# Patient Record
Sex: Female | Born: 1950 | Race: White | Hispanic: No | Marital: Single | Smoking: Former smoker
Health system: Southern US, Community
[De-identification: ages and names within clinical notes are randomized; demographics above are authoritative.]

## PROBLEM LIST (undated history)

## (undated) DIAGNOSIS — G2581 Restless legs syndrome: Secondary | ICD-10-CM

## (undated) DIAGNOSIS — E042 Nontoxic multinodular goiter: Secondary | ICD-10-CM

## (undated) DIAGNOSIS — N841 Polyp of cervix uteri: Secondary | ICD-10-CM

## (undated) DIAGNOSIS — K219 Gastro-esophageal reflux disease without esophagitis: Secondary | ICD-10-CM

## (undated) DIAGNOSIS — I340 Nonrheumatic mitral (valve) insufficiency: Secondary | ICD-10-CM

## (undated) DIAGNOSIS — Z972 Presence of dental prosthetic device (complete) (partial): Secondary | ICD-10-CM

## (undated) DIAGNOSIS — Z86010 Personal history of colonic polyps: Secondary | ICD-10-CM

## (undated) DIAGNOSIS — G4700 Insomnia, unspecified: Secondary | ICD-10-CM

## (undated) DIAGNOSIS — Z9889 Other specified postprocedural states: Secondary | ICD-10-CM

## (undated) DIAGNOSIS — R112 Nausea with vomiting, unspecified: Secondary | ICD-10-CM

## (undated) DIAGNOSIS — T7840XA Allergy, unspecified, initial encounter: Secondary | ICD-10-CM

## (undated) DIAGNOSIS — M1990 Unspecified osteoarthritis, unspecified site: Secondary | ICD-10-CM

## (undated) DIAGNOSIS — R011 Cardiac murmur, unspecified: Secondary | ICD-10-CM

## (undated) DIAGNOSIS — K449 Diaphragmatic hernia without obstruction or gangrene: Secondary | ICD-10-CM

## (undated) DIAGNOSIS — Z8489 Family history of other specified conditions: Secondary | ICD-10-CM

## (undated) DIAGNOSIS — H21569 Pupillary abnormality, unspecified eye: Secondary | ICD-10-CM

## (undated) DIAGNOSIS — E785 Hyperlipidemia, unspecified: Secondary | ICD-10-CM

## (undated) DIAGNOSIS — D179 Benign lipomatous neoplasm, unspecified: Secondary | ICD-10-CM

## (undated) DIAGNOSIS — M5030 Other cervical disc degeneration, unspecified cervical region: Secondary | ICD-10-CM

## (undated) DIAGNOSIS — M2040 Other hammer toe(s) (acquired), unspecified foot: Secondary | ICD-10-CM

## (undated) DIAGNOSIS — H5704 Mydriasis: Secondary | ICD-10-CM

## (undated) DIAGNOSIS — K2970 Gastritis, unspecified, without bleeding: Secondary | ICD-10-CM

## (undated) HISTORY — DX: Cardiac murmur, unspecified: R01.1

## (undated) HISTORY — DX: Polyp of cervix uteri: N84.1

## (undated) HISTORY — PX: THUMB FUSION: SUR636

## (undated) HISTORY — DX: Diaphragmatic hernia without obstruction or gangrene: K44.9

## (undated) HISTORY — PX: HAMMER TOE SURGERY: SHX385

## (undated) HISTORY — DX: Other hammer toe(s) (acquired), unspecified foot: M20.40

## (undated) HISTORY — PX: OTHER SURGICAL HISTORY: SHX169

## (undated) HISTORY — DX: Gastro-esophageal reflux disease without esophagitis: K21.9

## (undated) HISTORY — DX: Pupillary abnormality, unspecified eye: H21.569

## (undated) HISTORY — DX: Hyperlipidemia, unspecified: E78.5

## (undated) HISTORY — DX: Unspecified osteoarthritis, unspecified site: M19.90

## (undated) HISTORY — PX: SPINE SURGERY: SHX786

## (undated) HISTORY — DX: Allergy, unspecified, initial encounter: T78.40XA

## (undated) HISTORY — DX: Nonrheumatic mitral (valve) insufficiency: I34.0

## (undated) HISTORY — PX: PUPILLOPLASTY: SHX2277

## (undated) HISTORY — PX: EYE SURGERY: SHX253

## (undated) HISTORY — PX: CATARACT EXTRACTION: SUR2

---

## 1999-05-24 ENCOUNTER — Other Ambulatory Visit: Admission: RE | Admit: 1999-05-24 | Discharge: 1999-05-24 | Payer: Self-pay | Admitting: Internal Medicine

## 2000-07-20 ENCOUNTER — Other Ambulatory Visit: Admission: RE | Admit: 2000-07-20 | Discharge: 2000-07-20 | Payer: Self-pay | Admitting: Obstetrics & Gynecology

## 2001-04-12 DIAGNOSIS — M5030 Other cervical disc degeneration, unspecified cervical region: Secondary | ICD-10-CM

## 2001-04-12 HISTORY — DX: Other cervical disc degeneration, unspecified cervical region: M50.30

## 2001-05-09 ENCOUNTER — Encounter: Admission: RE | Admit: 2001-05-09 | Discharge: 2001-05-09 | Payer: Self-pay | Admitting: Internal Medicine

## 2001-05-09 ENCOUNTER — Encounter: Payer: Self-pay | Admitting: Internal Medicine

## 2002-01-22 ENCOUNTER — Encounter: Admission: RE | Admit: 2002-01-22 | Discharge: 2002-02-15 | Payer: Self-pay | Admitting: Orthopedic Surgery

## 2005-01-05 ENCOUNTER — Encounter: Admission: RE | Admit: 2005-01-05 | Discharge: 2005-01-05 | Payer: Self-pay | Admitting: Internal Medicine

## 2005-01-20 ENCOUNTER — Ambulatory Visit: Payer: Self-pay | Admitting: Gastroenterology

## 2005-02-25 ENCOUNTER — Ambulatory Visit: Payer: Self-pay | Admitting: Gastroenterology

## 2009-07-24 ENCOUNTER — Ambulatory Visit (HOSPITAL_COMMUNITY): Admission: RE | Admit: 2009-07-24 | Discharge: 2009-07-24 | Payer: Self-pay | Admitting: Emergency Medicine

## 2010-01-22 ENCOUNTER — Encounter (INDEPENDENT_AMBULATORY_CARE_PROVIDER_SITE_OTHER): Payer: Self-pay | Admitting: *Deleted

## 2010-07-30 ENCOUNTER — Encounter: Admission: RE | Admit: 2010-07-30 | Discharge: 2010-07-30 | Payer: Self-pay | Admitting: Orthopedic Surgery

## 2010-10-12 ENCOUNTER — Other Ambulatory Visit: Payer: Self-pay | Admitting: Orthopedic Surgery

## 2010-10-12 DIAGNOSIS — M5412 Radiculopathy, cervical region: Secondary | ICD-10-CM

## 2010-10-14 NOTE — Letter (Signed)
Summary: Colonoscopy Letter  [facility]  [address] [place], [place] [address]   Phone: [phone]  Fax: [phone]      Jan 22, 2010 MRN: [id number]   [facility] [address] [place], [place]  [address]   Dear Ms. Ortez,   According to your medical record, it is time for you to schedule a Colonoscopy. The American Cancer Society recommends this procedure as a method to detect early colon cancer. Patients with a family history of colon cancer, or a personal history of colon polyps or inflammatory bowel disease are at increased risk.  This letter has beeen generated based on the recommendations made at the time of your procedure. If you feel that in your particular situation this may no longer apply, please contact our office.  Please call our office at [phone] to schedule this appointment or to update your records at your earliest convenience.  Thank you for cooperating with [place] to provide you with the very best care possible.   Sincerely,  Judie Petit T. Russella Dar, M.D.  [facility] [phone]

## 2010-10-16 ENCOUNTER — Encounter: Payer: Self-pay | Admitting: Orthopedic Surgery

## 2010-10-18 ENCOUNTER — Ambulatory Visit
Admission: RE | Admit: 2010-10-18 | Discharge: 2010-10-18 | Disposition: A | Discharge status: Home / Self Care | Payer: 59 | Source: Ambulatory Visit | Attending: Orthopedic Surgery | Admitting: Orthopedic Surgery

## 2010-10-18 ENCOUNTER — Other Ambulatory Visit: Payer: Self-pay | Admitting: Orthopedic Surgery

## 2010-10-18 DIAGNOSIS — M5412 Radiculopathy, cervical region: Secondary | ICD-10-CM

## 2010-10-19 ENCOUNTER — Other Ambulatory Visit: Payer: Self-pay | Admitting: Internal Medicine

## 2010-10-19 DIAGNOSIS — E041 Nontoxic single thyroid nodule: Secondary | ICD-10-CM

## 2011-04-29 ENCOUNTER — Telehealth: Payer: Self-pay

## 2011-04-29 NOTE — Telephone Encounter (Signed)
Called patient to schedule recall Colonoscopy and patient states she would also like to have a Upper Endoscopy. Pt states she has had a lot of indigestion recently and needs to have a procedure for this. I rescheduled Colonoscopy for a office visit to discuss Upper Endoscopy. Pt will Dr. Russella Dar on 05/23/11 at 9:45am. Pt verbalized understanding.

## 2011-05-23 ENCOUNTER — Encounter: Payer: Self-pay | Admitting: Gastroenterology

## 2011-05-23 ENCOUNTER — Ambulatory Visit (INDEPENDENT_AMBULATORY_CARE_PROVIDER_SITE_OTHER): Payer: 59 | Admitting: Gastroenterology

## 2011-05-23 ENCOUNTER — Encounter: Payer: Self-pay | Admitting: Internal Medicine

## 2011-05-23 VITALS — BP 128/76 | HR 60 | Ht 67.0 in | Wt 146.0 lb

## 2011-05-23 DIAGNOSIS — Z8371 Family history of colonic polyps: Secondary | ICD-10-CM

## 2011-05-23 DIAGNOSIS — K219 Gastro-esophageal reflux disease without esophagitis: Secondary | ICD-10-CM

## 2011-05-23 DIAGNOSIS — Z83719 Family history of colon polyps, unspecified: Secondary | ICD-10-CM

## 2011-05-23 MED ORDER — NA SULFATE-K SULFATE-MG SULF 17.5-3.13-1.6 GM/177ML PO SOLN: ORAL | Status: DC

## 2011-05-23 NOTE — Patient Instructions (Signed)
You have been scheduled for an endoscopy and colonoscopy. Please follow the written instructions given to you at your visit today. Please pick up your Suprep at the pharmacy within the next 2-3 days. CC: Dr Earl Lites

## 2011-05-23 NOTE — Progress Notes (Signed)
History of Present Illness: This is a 60 year old female that I have seen in the past. She has a history of GERD and H. pylori gastritis. She underwent colonoscopy and upper endoscopy in June 2006 for family history of colon polyps and reflux symptoms. Cannot determine from the records if H. pylori was treated. She states that she has remained on medications for acid reflux since that time and currently is taking pantoprazole 40 mg twice daily. She feels that Protonix worked more effectively for controlling her reflux symptoms. At this point she notes about 2 episodes of breakthrough reflux symptoms each month. She takes Naprosyn twice each day. Denies weight loss, abdominal pain, constipation, diarrhea, change in stool caliber, melena, hematochezia, nausea, vomiting, dysphagia, chest pain.  Past Medical History  Diagnosis Date  . GERD (gastroesophageal reflux disease)   . Hyperlipidemia   . Endocervical polyp   . Mitral regurgitation   . Hammertoe   . Pupil irregular   . Arthritis   . Hiatal hernia   . Heart murmur    Past Surgical History  Procedure Date  . Hammer toe surgery   . Pupilloplasty     reports that she has quit smoking. She has never used smokeless tobacco. She reports that she drinks alcohol. She reports that she does not use illicit drugs. family history includes Colon polyps in her sister; Diabetes type II in her mother and sister; Heart disease in her father; Hypertension in her brother and sister; and Lung cancer in her mother.  There is no history of Colon cancer. No Known Allergies    Outpatient Encounter Prescriptions as of 05/23/2011  Medication Sig Dispense Refill  . naproxen (NAPROSYN) 500 MG tablet Take 500 mg by mouth 2 (two) times daily with a meal.        . pantoprazole (PROTONIX) 40 MG tablet Take 40 mg by mouth 2 (two) times daily.        . Na Sulfate-K Sulfate-Mg Sulf SOLN Use 1 kit as directed  1 Bottle  0   Review of Systems: Pertinent positive and  negative review of systems were noted in the above HPI section. All other review of systems were otherwise negative.  Physical Exam: General: Well developed , well nourished, no acute distress Head: Normocephalic and atraumatic Eyes:  sclerae anicteric, EOMI Ears: Normal auditory acuity Mouth: No deformity or lesions Neck: Supple, no masses or thyromegaly Lungs: Clear throughout to auscultation Heart: Regular rate and rhythm; no murmurs, rubs or bruits Abdomen: Soft, non tender and non distended. No masses, hepatosplenomegaly or hernias noted. Normal Bowel sounds Rectal: Deferred to colonoscopy Musculoskeletal: Symmetrical with no gross deformities  Skin: No lesions on visible extremities Pulses:  Normal pulses noted Extremities: No clubbing, cyanosis, edema or deformities noted Neurological: Alert oriented x 4, grossly nonfocal Cervical Nodes:  No significant cervical adenopathy Inguinal Nodes: No significant inguinal adenopathy Psychological:  Alert and cooperative. Normal mood and affect  Assessment and Recommendations:  1. Chronic GERD. Continue pantoprazole 40 mg twice daily along with standard antireflux measures. Rule out Barrett's esophagus and erosive esophagitis. Schedule upper endoscopy. The risks, benefits, and alternatives to endoscopy with possible biopsy and possible dilation were discussed with the patient and they consent to proceed.   2. History of H. pylori infection. Attempt to obtain records from 2006. Reassess endoscopy as above.  3. Family history of colon polyps in a sister. High risk for colorectal cancer. Schedule screening colonoscopy. The risks, benefits, and alternatives to colonoscopy with possible biopsy  and possible polypectomy were discussed with the patient and they consent to proceed.

## 2011-05-25 ENCOUNTER — Ambulatory Visit: Payer: 59 | Admitting: Gastroenterology

## 2011-05-27 ENCOUNTER — Ambulatory Visit (AMBULATORY_SURGERY_CENTER): Payer: 59 | Admitting: Gastroenterology

## 2011-05-27 ENCOUNTER — Encounter: Payer: Self-pay | Admitting: Gastroenterology

## 2011-05-27 VITALS — BP 133/77 | HR 68 | Temp 98.2°F | Resp 20 | Ht 67.0 in | Wt 146.0 lb

## 2011-05-27 DIAGNOSIS — K2970 Gastritis, unspecified, without bleeding: Secondary | ICD-10-CM

## 2011-05-27 DIAGNOSIS — K449 Diaphragmatic hernia without obstruction or gangrene: Secondary | ICD-10-CM

## 2011-05-27 DIAGNOSIS — K229 Disease of esophagus, unspecified: Secondary | ICD-10-CM

## 2011-05-27 DIAGNOSIS — K219 Gastro-esophageal reflux disease without esophagitis: Secondary | ICD-10-CM

## 2011-05-27 DIAGNOSIS — Z8371 Family history of colonic polyps: Secondary | ICD-10-CM

## 2011-05-27 DIAGNOSIS — Z1211 Encounter for screening for malignant neoplasm of colon: Secondary | ICD-10-CM

## 2011-05-27 DIAGNOSIS — K209 Esophagitis, unspecified: Secondary | ICD-10-CM

## 2011-05-27 DIAGNOSIS — K2990 Gastroduodenitis, unspecified, without bleeding: Secondary | ICD-10-CM

## 2011-05-27 HISTORY — DX: Gastritis, unspecified, without bleeding: K29.70

## 2011-05-27 HISTORY — DX: Diaphragmatic hernia without obstruction or gangrene: K44.9

## 2011-05-27 HISTORY — PX: ESOPHAGOGASTRODUODENOSCOPY: SHX1529

## 2011-05-27 MED ORDER — SODIUM CHLORIDE 0.9 % IV SOLN: 500.0000 mL | INTRAVENOUS | Status: DC

## 2011-05-27 NOTE — Patient Instructions (Signed)
Follow your discharge instructions.  Continue your PPI.  Await pathology results.

## 2011-05-30 ENCOUNTER — Telehealth: Payer: Self-pay

## 2011-05-30 NOTE — Telephone Encounter (Signed)

## 2011-06-01 ENCOUNTER — Encounter: Payer: Self-pay | Admitting: Gastroenterology

## 2011-10-21 ENCOUNTER — Other Ambulatory Visit: Payer: Self-pay | Admitting: Physician Assistant

## 2011-10-21 MED ORDER — NAPROXEN 500 MG PO TABS: 500.0000 mg | ORAL_TABLET | Freq: Two times a day (BID) | ORAL | Status: DC

## 2011-12-02 ENCOUNTER — Other Ambulatory Visit: Payer: Self-pay | Admitting: Physician Assistant

## 2011-12-27 ENCOUNTER — Ambulatory Visit (INDEPENDENT_AMBULATORY_CARE_PROVIDER_SITE_OTHER): Payer: 59 | Admitting: Emergency Medicine

## 2011-12-27 VITALS — BP 116/76 | HR 65 | Temp 97.6°F | Resp 16 | Ht 67.0 in | Wt 145.0 lb

## 2011-12-27 DIAGNOSIS — K219 Gastro-esophageal reflux disease without esophagitis: Secondary | ICD-10-CM

## 2011-12-27 DIAGNOSIS — E785 Hyperlipidemia, unspecified: Secondary | ICD-10-CM

## 2011-12-27 DIAGNOSIS — E782 Mixed hyperlipidemia: Secondary | ICD-10-CM

## 2011-12-27 DIAGNOSIS — M545 Low back pain: Secondary | ICD-10-CM

## 2011-12-27 DIAGNOSIS — M549 Dorsalgia, unspecified: Secondary | ICD-10-CM

## 2011-12-27 LAB — CBC WITH DIFFERENTIAL/PLATELET
Basophils Absolute: 0 10*3/uL (ref 0.0–0.1)
Basophils Relative: 0 % (ref 0–1)
HCT: 45 % (ref 36.0–46.0)
MCHC: 32.7 g/dL (ref 30.0–36.0)
Monocytes Absolute: 0.2 10*3/uL (ref 0.1–1.0)
Neutro Abs: 3.6 10*3/uL (ref 1.7–7.7)
Neutrophils Relative %: 68 % (ref 43–77)
Platelets: 321 10*3/uL (ref 150–400)
RDW: 13.5 % (ref 11.5–15.5)

## 2011-12-27 LAB — COMPREHENSIVE METABOLIC PANEL
AST: 27 U/L (ref 0–37)
Albumin: 4.7 g/dL (ref 3.5–5.2)
Alkaline Phosphatase: 52 U/L (ref 39–117)
BUN: 16 mg/dL (ref 6–23)
Potassium: 4.6 mEq/L (ref 3.5–5.3)

## 2011-12-27 LAB — LIPID PANEL
HDL: 64 mg/dL (ref >39)
LDL Cholesterol: 153 mg/dL — ABNORMAL HIGH (ref 0–99)
Total CHOL/HDL Ratio: 3.7 Ratio

## 2011-12-27 MED ORDER — NAPROXEN 500 MG PO TABS: 500.0000 mg | ORAL_TABLET | Freq: Two times a day (BID) | ORAL | Status: DC

## 2011-12-27 MED ORDER — PANTOPRAZOLE SODIUM 40 MG PO TBEC: 40.0000 mg | DELAYED_RELEASE_TABLET | Freq: Two times a day (BID) | ORAL | Status: DC

## 2011-12-27 NOTE — Progress Notes (Signed)
  Subjective:    Patient ID: Rachel Boone, female    DOB: December 22, 1950, 61 y.o.   MRN: [id number]  HPI patient enters because she wants to get her medications refilled. She's currently on Protonix twice a day and naproxen twice a day. She is having significant problems with her back and is seeing Dr. Robyne Askew and is scheduled to have. Steroid injections.    Review of Systems patient states she is overall doing well. She has been working some doing yard work     Objective:   Physical Exam  Constitutional:       Patient appears somewhat depressed but denies being depressed  HENT:  Head: Normocephalic.  Eyes:       There is distortion of the right pupil   Neck: No thyromegaly present.  Cardiovascular: Normal rate and regular rhythm.   Pulmonary/Chest: Effort normal and breath sounds normal.  Abdominal: She exhibits no distension. There is no tenderness. There is no rebound.   Breast exam is normal no masses are felt       Assessment & Plan:   Encourage patient to schedule herself for breast mammogram. I gave her a prescription for the shingles vaccine. She is up-to-date for her T. DAp. Routine labs were drawn to followup her using Naprosyn on a regular basis

## 2011-12-30 ENCOUNTER — Other Ambulatory Visit: Payer: Self-pay | Admitting: Emergency Medicine

## 2011-12-30 DIAGNOSIS — Z1231 Encounter for screening mammogram for malignant neoplasm of breast: Secondary | ICD-10-CM

## 2012-01-03 ENCOUNTER — Ambulatory Visit (HOSPITAL_COMMUNITY)
Admission: RE | Admit: 2012-01-03 | Discharge: 2012-01-03 | Disposition: A | Discharge status: Home / Self Care | Payer: 59 | Source: Ambulatory Visit | Attending: Emergency Medicine | Admitting: Emergency Medicine

## 2012-01-03 DIAGNOSIS — Z1231 Encounter for screening mammogram for malignant neoplasm of breast: Secondary | ICD-10-CM | POA: Insufficient documentation

## 2012-01-06 ENCOUNTER — Telehealth: Payer: Self-pay

## 2012-01-06 MED ORDER — PANTOPRAZOLE SODIUM 40 MG PO TBEC: 40.0000 mg | DELAYED_RELEASE_TABLET | Freq: Two times a day (BID) | ORAL | Status: DC

## 2012-01-06 NOTE — Telephone Encounter (Signed)
Can we do this ?

## 2012-01-06 NOTE — Telephone Encounter (Signed)
LMOM to call back

## 2012-01-06 NOTE — Telephone Encounter (Signed)
Pt would like to know if we could rewrite the protonix with a 90 day supply.

## 2012-01-07 NOTE — Telephone Encounter (Signed)
Pt.notified

## 2012-04-24 ENCOUNTER — Other Ambulatory Visit: Payer: Self-pay | Admitting: *Deleted

## 2012-04-24 MED ORDER — NAPROXEN 500 MG PO TABS: 500.0000 mg | ORAL_TABLET | Freq: Two times a day (BID) | ORAL | Status: DC

## 2012-04-24 MED ORDER — CYCLOBENZAPRINE HCL 10 MG PO TABS: 10.0000 mg | ORAL_TABLET | Freq: Three times a day (TID) | ORAL | Status: AC | PRN

## 2012-06-01 ENCOUNTER — Other Ambulatory Visit: Payer: Self-pay | Admitting: Neurosurgery

## 2012-07-05 ENCOUNTER — Encounter (HOSPITAL_COMMUNITY): Payer: Self-pay | Admitting: Pharmacy Technician

## 2012-07-06 NOTE — Pre-Procedure Instructions (Signed)
20 Rachel Boone  07/06/2012   Your procedure is scheduled on:  Thursday July 19, 2012  Report to [facility] at 5:30 AM.  Call this number if you have problems the morning of surgery: [phone]   Remember:   Do not eat food or drink :After Midnight.      Take these medicines the morning of surgery with A SIP OF WATER: protonix   Do not wear jewelry, make-up or nail polish.  Do not wear lotions, powders, or perfumes.  Do not shave 48 hours prior to surgery. Men may shave face and neck.  Do not bring valuables to the hospital.  Contacts, dentures or bridgework may not be worn into surgery.  Leave suitcase in the car. After surgery it may be brought to your room.  For patients admitted to the hospital, checkout time is 11:00 AM the day of discharge.   Patients discharged the day of surgery will not be allowed to drive home.  Name and phone number of your driver: family / friend  Special Instructions: Shower using CHG 2 nights before surgery and the night before surgery.  If you shower the day of surgery use CHG.  Use special wash - you have one bottle of CHG for all showers.  You should use approximately 1/3 of the bottle for each shower.   Please read over the following fact sheets that you were given: Pain Booklet, Coughing and Deep Breathing, Blood Transfusion Information, MRSA Information and Surgical Site Infection Prevention

## 2012-07-09 ENCOUNTER — Encounter (HOSPITAL_COMMUNITY): Payer: Self-pay

## 2012-07-09 ENCOUNTER — Encounter (HOSPITAL_COMMUNITY)
Admission: RE | Admit: 2012-07-09 | Discharge: 2012-07-09 | Disposition: A | Discharge status: Home / Self Care | Payer: 59 | Source: Ambulatory Visit | Attending: Neurosurgery | Admitting: Neurosurgery

## 2012-07-09 HISTORY — DX: Other specified postprocedural states: Z98.890

## 2012-07-09 HISTORY — DX: Nausea with vomiting, unspecified: R11.2

## 2012-07-09 LAB — CBC
HCT: 39.9 % (ref 36.0–46.0)
Hemoglobin: 13.5 g/dL (ref 12.0–15.0)
MCH: 29.3 pg (ref 26.0–34.0)
MCHC: 33.8 g/dL (ref 30.0–36.0)
MCV: 86.6 fL (ref 78.0–100.0)
Platelets: 248 10*3/uL (ref 150–400)
RBC: 4.61 MIL/uL (ref 3.87–5.11)
RDW: 12.9 % (ref 11.5–15.5)
WBC: 3.9 10*3/uL — ABNORMAL LOW (ref 4.0–10.5)

## 2012-07-09 LAB — SURGICAL PCR SCREEN
MRSA, PCR: NEGATIVE
Staphylococcus aureus: NEGATIVE

## 2012-07-09 LAB — BASIC METABOLIC PANEL
BUN: 20 mg/dL (ref 6–23)
CO2: 29 mEq/L (ref 19–32)
Calcium: 9.5 mg/dL (ref 8.4–10.5)
Chloride: 103 mEq/L (ref 96–112)
Creatinine, Ser: 0.75 mg/dL (ref 0.50–1.10)
GFR calc Af Amer: >90 mL/min (ref >90)
GFR calc non Af Amer: 90 mL/min — ABNORMAL LOW (ref >90)
Glucose, Bld: 99 mg/dL (ref 70–99)
Potassium: 4.1 mEq/L (ref 3.5–5.1)
Sodium: 140 mEq/L (ref 135–145)

## 2012-07-09 NOTE — Progress Notes (Signed)
Pt. Request that T&S be done DOS,works outside and does not want to wear blood band for 13 days.

## 2012-07-18 MED ORDER — CEFAZOLIN SODIUM-DEXTROSE 2-3 GM-% IV SOLR
2.0000 g | INTRAVENOUS | Status: AC
  Administered 2012-07-19 (×2): 2 g via INTRAVENOUS
  Filled 2012-07-18: qty 50

## 2012-07-19 ENCOUNTER — Ambulatory Visit (HOSPITAL_COMMUNITY): Payer: 59

## 2012-07-19 ENCOUNTER — Encounter (HOSPITAL_COMMUNITY): Payer: Self-pay | Admitting: Anesthesiology

## 2012-07-19 ENCOUNTER — Inpatient Hospital Stay (HOSPITAL_COMMUNITY)
Admission: RE | Admit: 2012-07-19 | Discharge: 2012-07-23 | DRG: 455 | Disposition: A | Discharge status: Home / Self Care | Payer: 59 | Source: Ambulatory Visit | Attending: Neurosurgery | Admitting: Neurosurgery

## 2012-07-19 ENCOUNTER — Encounter (HOSPITAL_COMMUNITY): Payer: Self-pay | Admitting: *Deleted

## 2012-07-19 ENCOUNTER — Encounter (HOSPITAL_COMMUNITY)
Admission: RE | Disposition: A | Discharge status: Home / Self Care | Payer: Self-pay | Source: Ambulatory Visit | Attending: Neurosurgery

## 2012-07-19 ENCOUNTER — Ambulatory Visit (HOSPITAL_COMMUNITY): Payer: 59 | Admitting: Anesthesiology

## 2012-07-19 DIAGNOSIS — M4150 Other secondary scoliosis, site unspecified: Secondary | ICD-10-CM | POA: Diagnosis present

## 2012-07-19 DIAGNOSIS — IMO0002 Reserved for concepts with insufficient information to code with codable children: Secondary | ICD-10-CM

## 2012-07-19 DIAGNOSIS — M51379 Other intervertebral disc degeneration, lumbosacral region without mention of lumbar back pain or lower extremity pain: Secondary | ICD-10-CM | POA: Diagnosis present

## 2012-07-19 DIAGNOSIS — M5137 Other intervertebral disc degeneration, lumbosacral region: Secondary | ICD-10-CM | POA: Diagnosis present

## 2012-07-19 DIAGNOSIS — Z884 Allergy status to anesthetic agent status: Secondary | ICD-10-CM

## 2012-07-19 DIAGNOSIS — Z791 Long term (current) use of non-steroidal anti-inflammatories (NSAID): Secondary | ICD-10-CM

## 2012-07-19 DIAGNOSIS — Z87891 Personal history of nicotine dependence: Secondary | ICD-10-CM

## 2012-07-19 DIAGNOSIS — K219 Gastro-esophageal reflux disease without esophagitis: Secondary | ICD-10-CM | POA: Diagnosis present

## 2012-07-19 DIAGNOSIS — M47817 Spondylosis without myelopathy or radiculopathy, lumbosacral region: Principal | ICD-10-CM | POA: Diagnosis present

## 2012-07-19 DIAGNOSIS — I059 Rheumatic mitral valve disease, unspecified: Secondary | ICD-10-CM | POA: Diagnosis present

## 2012-07-19 DIAGNOSIS — E785 Hyperlipidemia, unspecified: Secondary | ICD-10-CM | POA: Diagnosis present

## 2012-07-19 HISTORY — PX: LUMBAR PERCUTANEOUS PEDICLE SCREW 3 LEVEL: SHX5562

## 2012-07-19 HISTORY — PX: ANTERIOR LATERAL LUMBAR FUSION 4 LEVELS: SHX5552

## 2012-07-19 LAB — ABO/RH: ABO/RH(D): A POS

## 2012-07-19 LAB — TYPE AND SCREEN
ABO/RH(D): A POS
Antibody Screen: NEGATIVE

## 2012-07-19 SURGERY — ANTERIOR LATERAL LUMBAR FUSION 4 LEVELS
Anesthesia: General | Site: Back | Wound class: Clean

## 2012-07-19 MED ORDER — SODIUM CHLORIDE 0.9 % IV SOLN
10.0000 mg | INTRAVENOUS | Status: DC | PRN
  Administered 2012-07-19: 16:00:00 via INTRAVENOUS
  Administered 2012-07-19: 50 ug/min via INTRAVENOUS

## 2012-07-19 MED ORDER — ACETAMINOPHEN 10 MG/ML IV SOLN
INTRAVENOUS | Status: AC
  Filled 2012-07-19: qty 100

## 2012-07-19 MED ORDER — PROPOFOL 10 MG/ML IV BOLUS
INTRAVENOUS | Status: DC | PRN
  Administered 2012-07-19: 150 mg via INTRAVENOUS
  Administered 2012-07-19: 50 mg via INTRAVENOUS
  Administered 2012-07-19: 30 mg via INTRAVENOUS
  Administered 2012-07-19: 50 mg via INTRAVENOUS

## 2012-07-19 MED ORDER — BUPIVACAINE HCL (PF) 0.5 % IJ SOLN
INTRAMUSCULAR | Status: DC | PRN
  Administered 2012-07-19: 31 mL
  Administered 2012-07-19: 25 mL

## 2012-07-19 MED ORDER — MEPERIDINE HCL 25 MG/ML IJ SOLN: 6.2500 mg | INTRAMUSCULAR | Status: DC | PRN

## 2012-07-19 MED ORDER — PANTOPRAZOLE SODIUM 40 MG PO TBEC
40.0000 mg | DELAYED_RELEASE_TABLET | Freq: Every day | ORAL | Status: DC
  Administered 2012-07-20 – 2012-07-23 (×4): 40 mg via ORAL
  Filled 2012-07-19 (×4): qty 1

## 2012-07-19 MED ORDER — MORPHINE SULFATE 4 MG/ML IJ SOLN
4.0000 mg | INTRAMUSCULAR | Status: DC | PRN
  Filled 2012-07-19: qty 1

## 2012-07-19 MED ORDER — HYDROMORPHONE HCL PF 1 MG/ML IJ SOLN
INTRAMUSCULAR | Status: AC
  Filled 2012-07-19: qty 1

## 2012-07-19 MED ORDER — OXYCODONE HCL 5 MG PO TABS: 5.0000 mg | ORAL_TABLET | Freq: Once | ORAL | Status: DC | PRN

## 2012-07-19 MED ORDER — ONDANSETRON HCL 4 MG/2ML IJ SOLN
INTRAMUSCULAR | Status: DC | PRN
  Administered 2012-07-19: 4 mg via INTRAVENOUS

## 2012-07-19 MED ORDER — KCL IN DEXTROSE-NACL 20-5-0.45 MEQ/L-%-% IV SOLN
INTRAVENOUS | Status: DC
  Administered 2012-07-19: 22:00:00 via INTRAVENOUS
  Administered 2012-07-20: 1000 mL via INTRAVENOUS
  Administered 2012-07-20: 125 mL via INTRAVENOUS
  Administered 2012-07-20: 07:00:00 via INTRAVENOUS
  Filled 2012-07-19 (×10): qty 1000

## 2012-07-19 MED ORDER — ZOLPIDEM TARTRATE 5 MG PO TABS
5.0000 mg | ORAL_TABLET | Freq: Every evening | ORAL | Status: DC | PRN
  Administered 2012-07-20 – 2012-07-21 (×2): 5 mg via ORAL
  Filled 2012-07-19 (×2): qty 1

## 2012-07-19 MED ORDER — CEFAZOLIN SODIUM-DEXTROSE 2-3 GM-% IV SOLR
INTRAVENOUS | Status: AC
  Filled 2012-07-19: qty 50

## 2012-07-19 MED ORDER — OXYCODONE HCL 5 MG/5ML PO SOLN: 5.0000 mg | Freq: Once | ORAL | Status: DC | PRN

## 2012-07-19 MED ORDER — ONDANSETRON HCL 4 MG/2ML IJ SOLN
4.0000 mg | INTRAMUSCULAR | Status: DC | PRN
  Administered 2012-07-19: 4 mg via INTRAVENOUS

## 2012-07-19 MED ORDER — BISACODYL 10 MG RE SUPP: 10.0000 mg | Freq: Every day | RECTAL | Status: DC | PRN

## 2012-07-19 MED ORDER — HYDROXYZINE HCL 50 MG/ML IM SOLN
50.0000 mg | INTRAMUSCULAR | Status: DC | PRN
  Filled 2012-07-19: qty 1

## 2012-07-19 MED ORDER — BIOTENE DRY MOUTH MT LIQD
15.0000 mL | Freq: Two times a day (BID) | OROMUCOSAL | Status: DC
  Administered 2012-07-19 – 2012-07-20 (×3): 15 mL via OROMUCOSAL

## 2012-07-19 MED ORDER — SUCCINYLCHOLINE CHLORIDE 20 MG/ML IJ SOLN
INTRAMUSCULAR | Status: DC | PRN
  Administered 2012-07-19: 160 mg via INTRAVENOUS

## 2012-07-19 MED ORDER — SODIUM CHLORIDE 0.9 % IV SOLN
INTRAVENOUS | Status: AC
  Filled 2012-07-19: qty 500

## 2012-07-19 MED ORDER — HYDROMORPHONE HCL PF 1 MG/ML IJ SOLN
0.2500 mg | INTRAMUSCULAR | Status: DC | PRN
  Administered 2012-07-19 (×4): 0.5 mg via INTRAVENOUS

## 2012-07-19 MED ORDER — LACTATED RINGERS IV SOLN
INTRAVENOUS | Status: DC | PRN
  Administered 2012-07-19 (×4): via INTRAVENOUS

## 2012-07-19 MED ORDER — KETOROLAC TROMETHAMINE 30 MG/ML IJ SOLN
30.0000 mg | Freq: Once | INTRAMUSCULAR | Status: AC
  Administered 2012-07-19: 30 mg via INTRAVENOUS

## 2012-07-19 MED ORDER — ACETAMINOPHEN 10 MG/ML IV SOLN
1000.0000 mg | Freq: Once | INTRAVENOUS | Status: AC
  Administered 2012-07-19: 1000 mg via INTRAVENOUS
  Filled 2012-07-19: qty 100

## 2012-07-19 MED ORDER — SODIUM CHLORIDE 0.9 % IV SOLN: 250.0000 mL | INTRAVENOUS | Status: DC

## 2012-07-19 MED ORDER — MENTHOL 3 MG MT LOZG: 1.0000 | LOZENGE | OROMUCOSAL | Status: DC | PRN

## 2012-07-19 MED ORDER — LIDOCAINE-EPINEPHRINE 1 %-1:100000 IJ SOLN
INTRAMUSCULAR | Status: DC | PRN
  Administered 2012-07-19: 31 mL
  Administered 2012-07-19: 25 mL

## 2012-07-19 MED ORDER — ALUM & MAG HYDROXIDE-SIMETH 200-200-20 MG/5ML PO SUSP
30.0000 mL | Freq: Four times a day (QID) | ORAL | Status: DC | PRN
Start: 2012-07-19 — End: 2012-07-23

## 2012-07-19 MED ORDER — DROPERIDOL 2.5 MG/ML IJ SOLN
INTRAMUSCULAR | Status: DC | PRN
  Administered 2012-07-19: 0.625 mg via INTRAVENOUS

## 2012-07-19 MED ORDER — CYCLOBENZAPRINE HCL 10 MG PO TABS
10.0000 mg | ORAL_TABLET | Freq: Three times a day (TID) | ORAL | Status: DC | PRN
  Administered 2012-07-20 – 2012-07-22 (×3): 10 mg via ORAL
  Filled 2012-07-19 (×3): qty 1

## 2012-07-19 MED ORDER — DEXAMETHASONE SODIUM PHOSPHATE 4 MG/ML IJ SOLN
INTRAMUSCULAR | Status: DC | PRN
  Administered 2012-07-19: 10 mg via INTRAVENOUS

## 2012-07-19 MED ORDER — MIDAZOLAM HCL 5 MG/5ML IJ SOLN
INTRAMUSCULAR | Status: DC | PRN
  Administered 2012-07-19: 2 mg via INTRAVENOUS

## 2012-07-19 MED ORDER — BACITRACIN 50000 UNITS IM SOLR
INTRAMUSCULAR | Status: AC
  Filled 2012-07-19: qty 1

## 2012-07-19 MED ORDER — ACETAMINOPHEN 650 MG RE SUPP: 650.0000 mg | RECTAL | Status: DC | PRN

## 2012-07-19 MED ORDER — ONDANSETRON HCL 4 MG/2ML IJ SOLN
INTRAMUSCULAR | Status: AC
  Filled 2012-07-19: qty 2

## 2012-07-19 MED ORDER — PROMETHAZINE HCL 25 MG/ML IJ SOLN: 12.5000 mg | INTRAMUSCULAR | Status: DC | PRN

## 2012-07-19 MED ORDER — MAGNESIUM HYDROXIDE 400 MG/5ML PO SUSP: 30.0000 mL | Freq: Every day | ORAL | Status: DC | PRN

## 2012-07-19 MED ORDER — ONDANSETRON HCL 4 MG/2ML IJ SOLN: 4.0000 mg | Freq: Once | INTRAMUSCULAR | Status: DC | PRN

## 2012-07-19 MED ORDER — SODIUM CHLORIDE 0.9 % IJ SOLN
3.0000 mL | Freq: Two times a day (BID) | INTRAMUSCULAR | Status: DC
  Administered 2012-07-20: 3 mL via INTRAVENOUS

## 2012-07-19 MED ORDER — SODIUM CHLORIDE 0.9 % IJ SOLN: 3.0000 mL | INTRAMUSCULAR | Status: DC | PRN

## 2012-07-19 MED ORDER — HYDROXYZINE HCL 25 MG PO TABS
50.0000 mg | ORAL_TABLET | ORAL | Status: DC | PRN
  Filled 2012-07-19: qty 1

## 2012-07-19 MED ORDER — SODIUM CHLORIDE 0.9 % IR SOLN
Status: DC | PRN
  Administered 2012-07-19 (×2)

## 2012-07-19 MED ORDER — ACETAMINOPHEN 10 MG/ML IV SOLN
1000.0000 mg | Freq: Four times a day (QID) | INTRAVENOUS | Status: AC
  Administered 2012-07-19 – 2012-07-20 (×4): 1000 mg via INTRAVENOUS
  Filled 2012-07-19 (×4): qty 100

## 2012-07-19 MED ORDER — OXYCODONE HCL 5 MG PO TABS
5.0000 mg | ORAL_TABLET | ORAL | Status: DC | PRN
  Administered 2012-07-21 – 2012-07-22 (×3): 10 mg via ORAL
  Filled 2012-07-19 (×4): qty 2

## 2012-07-19 MED ORDER — KETOROLAC TROMETHAMINE 30 MG/ML IJ SOLN
30.0000 mg | Freq: Four times a day (QID) | INTRAMUSCULAR | Status: AC
  Administered 2012-07-20 – 2012-07-21 (×8): 30 mg via INTRAVENOUS
  Filled 2012-07-19 (×7): qty 1

## 2012-07-19 MED ORDER — KETOROLAC TROMETHAMINE 30 MG/ML IJ SOLN
INTRAMUSCULAR | Status: AC
  Filled 2012-07-19: qty 1

## 2012-07-19 MED ORDER — ARTIFICIAL TEARS OP OINT
TOPICAL_OINTMENT | OPHTHALMIC | Status: DC | PRN
  Administered 2012-07-19: 1 via OPHTHALMIC

## 2012-07-19 MED ORDER — ACETAMINOPHEN 325 MG PO TABS: 650.0000 mg | ORAL_TABLET | ORAL | Status: DC | PRN

## 2012-07-19 MED ORDER — FENTANYL CITRATE 0.05 MG/ML IJ SOLN
INTRAMUSCULAR | Status: DC | PRN
  Administered 2012-07-19: 50 ug via INTRAVENOUS
  Administered 2012-07-19: 100 ug via INTRAVENOUS
  Administered 2012-07-19: 150 ug via INTRAVENOUS
  Administered 2012-07-19 (×2): 50 ug via INTRAVENOUS
  Administered 2012-07-19: 100 ug via INTRAVENOUS

## 2012-07-19 MED ORDER — PHENOL 1.4 % MT LIQD: 1.0000 | OROMUCOSAL | Status: DC | PRN

## 2012-07-19 MED ORDER — GLYCOPYRROLATE 0.2 MG/ML IJ SOLN
INTRAMUSCULAR | Status: DC | PRN
  Administered 2012-07-19: 0.2 mg via INTRAVENOUS

## 2012-07-19 MED ORDER — THROMBIN 20000 UNITS EX SOLR
CUTANEOUS | Status: DC | PRN
  Administered 2012-07-19 (×2): via TOPICAL

## 2012-07-19 MED ORDER — 0.9 % SODIUM CHLORIDE (POUR BTL) OPTIME
TOPICAL | Status: DC | PRN
  Administered 2012-07-19 (×2): 1000 mL

## 2012-07-19 MED ORDER — MORPHINE SULFATE 4 MG/ML IJ SOLN
4.0000 mg | INTRAMUSCULAR | Status: DC | PRN
  Administered 2012-07-19 – 2012-07-20 (×4): 4 mg via INTRAVENOUS
  Filled 2012-07-19 (×3): qty 1

## 2012-07-19 MED ORDER — LIDOCAINE HCL (CARDIAC) 20 MG/ML IV SOLN
INTRAVENOUS | Status: DC | PRN
  Administered 2012-07-19: 100 mg via INTRAVENOUS

## 2012-07-19 MED ORDER — HYDROMORPHONE HCL PF 1 MG/ML IJ SOLN
0.5000 mg | INTRAMUSCULAR | Status: DC | PRN
  Administered 2012-07-19 (×2): 0.5 mg via INTRAVENOUS

## 2012-07-19 SURGICAL SUPPLY — 99 items
ADH SKN CLS APL DERMABOND .7 (GAUZE/BANDAGES/DRESSINGS)
ADH SKN CLS LQ APL DERMABOND (GAUZE/BANDAGES/DRESSINGS) ×16
APL SKNCLS STERI-STRIP NONHPOA (GAUZE/BANDAGES/DRESSINGS)
BAG DECANTER FOR FLEXI CONT (MISCELLANEOUS) ×3 IMPLANT
BENDINI DIGITIZER ×1 IMPLANT
BENZOIN TINCTURE PRP APPL 2/3 (GAUZE/BANDAGES/DRESSINGS) ×2 IMPLANT
BLADE SURG ROTATE 9660 (MISCELLANEOUS) IMPLANT
BRUSH SCRUB EZ PLAIN DRY (MISCELLANEOUS) ×3 IMPLANT
CLIP NEUROVISION LG (CLIP) ×1 IMPLANT
CLIP NVM5 (CLIP) ×1 IMPLANT
CLOTH BEACON ORANGE TIMEOUT ST (SAFETY) ×6 IMPLANT
CONT SPEC 4OZ CLIKSEAL STRL BL (MISCELLANEOUS) ×6 IMPLANT
COROENT XL-W 10X22X50 (Orthopedic Implant) ×1 IMPLANT
COVER BACK TABLE 24X17X13 BIG (DRAPES) IMPLANT
COVER TABLE BACK 60X90 (DRAPES) ×2 IMPLANT
DERMABOND ADHESIVE PROPEN (GAUZE/BANDAGES/DRESSINGS) ×8
DERMABOND ADVANCED (GAUZE/BANDAGES/DRESSINGS)
DERMABOND ADVANCED .7 DNX12 (GAUZE/BANDAGES/DRESSINGS) ×4 IMPLANT
DERMABOND ADVANCED .7 DNX6 (GAUZE/BANDAGES/DRESSINGS) IMPLANT
DRAPE C-ARM 42X72 X-RAY (DRAPES) ×6 IMPLANT
DRAPE C-ARMOR (DRAPES) ×6 IMPLANT
DRAPE LAPAROTOMY 100X72X124 (DRAPES) ×6 IMPLANT
DRAPE POUCH INSTRU U-SHP 10X18 (DRAPES) ×6 IMPLANT
DURAPREP 26ML APPLICATOR (WOUND CARE) ×2 IMPLANT
ELECT REM PT RETURN 9FT ADLT (ELECTROSURGICAL) ×6
ELECTRODE REM PT RTRN 9FT ADLT (ELECTROSURGICAL) ×4 IMPLANT
GAUZE SPONGE 4X4 16PLY XRAY LF (GAUZE/BANDAGES/DRESSINGS) ×4 IMPLANT
GLOVE BIO SURGEON STRL SZ8 (GLOVE) ×3 IMPLANT
GLOVE BIOGEL PI IND STRL 7.5 (GLOVE) IMPLANT
GLOVE BIOGEL PI IND STRL 8 (GLOVE) ×4 IMPLANT
GLOVE BIOGEL PI IND STRL 8.5 (GLOVE) IMPLANT
GLOVE BIOGEL PI INDICATOR 7.5 (GLOVE) ×1
GLOVE BIOGEL PI INDICATOR 8 (GLOVE) ×6
GLOVE BIOGEL PI INDICATOR 8.5 (GLOVE) ×1
GLOVE ECLIPSE 7.5 STRL STRAW (GLOVE) ×16 IMPLANT
GLOVE EXAM NITRILE LRG STRL (GLOVE) IMPLANT
GLOVE EXAM NITRILE MD LF STRL (GLOVE) ×1 IMPLANT
GLOVE EXAM NITRILE XL STR (GLOVE) IMPLANT
GLOVE EXAM NITRILE XS STR PU (GLOVE) IMPLANT
GOWN BRE IMP SLV AUR LG STRL (GOWN DISPOSABLE) IMPLANT
GOWN BRE IMP SLV AUR XL STRL (GOWN DISPOSABLE) ×12 IMPLANT
GOWN STRL REIN 2XL LVL4 (GOWN DISPOSABLE) ×2 IMPLANT
GUIDEWIRE NITINOL BEVEL TIP (WIRE) ×9 IMPLANT
IMPL COROENT XL 10X18X50 (Neuro Prosthesis/Implant) IMPLANT
IMPL COROENT XL 8X8X45 (Intraocular Lens) IMPLANT
IMPLANT COROENT LDTXL 10X18X50 (Intraocular Lens) ×1 IMPLANT
IMPLANT COROENT XL 10X18X50 (Neuro Prosthesis/Implant) ×3 IMPLANT
IMPLANT COROENT XL 8X8X45 (Intraocular Lens) ×6 IMPLANT
KIT BASIN OR (CUSTOM PROCEDURE TRAY) ×6 IMPLANT
KIT DILATOR XLIF 5 (KITS) IMPLANT
KIT INFUSE SMALL (Orthopedic Implant) ×1 IMPLANT
KIT INFUSE XX SMALL 0.7CC (Orthopedic Implant) ×1 IMPLANT
KIT MAXCESS (KITS) ×1 IMPLANT
KIT NDL NVM5 EMG ELECT (KITS) IMPLANT
KIT NEEDLE NVM5 EMG ELECT (KITS) ×2 IMPLANT
KIT NEEDLE NVM5 EMG ELECTRODE (KITS) ×1
KIT POSITION SURG JACKSON T1 (MISCELLANEOUS) IMPLANT
KIT ROOM TURNOVER OR (KITS) ×6 IMPLANT
KIT XLIF (KITS) ×1
LIGHT SOURCE ANGLE TIP STR 7FT (MISCELLANEOUS) ×1 IMPLANT
MARKER SKIN DUAL TIP RULER LAB (MISCELLANEOUS) ×3 IMPLANT
NDL HYPO 25X1 1.5 SAFETY (NEEDLE) ×2 IMPLANT
NDL I PASS (NEEDLE) IMPLANT
NDL I-PASS III (NEEDLE) IMPLANT
NDL SPNL 22GX3.5 QUINCKE BK (NEEDLE) ×2 IMPLANT
NEEDLE HYPO 25X1 1.5 SAFETY (NEEDLE) ×6 IMPLANT
NEEDLE I PASS (NEEDLE) ×3 IMPLANT
NEEDLE I-PASS III (NEEDLE) ×3 IMPLANT
NEEDLE SPNL 22GX3.5 QUINCKE BK (NEEDLE) ×6 IMPLANT
NS IRRIG 1000ML POUR BTL (IV SOLUTION) ×6 IMPLANT
NVMG WIRE ×1 IMPLANT
PACK LAMINECTOMY NEURO (CUSTOM PROCEDURE TRAY) ×6 IMPLANT
PAD ARMBOARD 7.5X6 YLW CONV (MISCELLANEOUS) ×9 IMPLANT
PATTIES SURGICAL .5 X.5 (GAUZE/BANDAGES/DRESSINGS) IMPLANT
PATTIES SURGICAL .5 X1 (DISPOSABLE) IMPLANT
PATTIES SURGICAL 1X1 (DISPOSABLE) IMPLANT
SCREW POLY 6.5X40MM (Screw) ×2 IMPLANT
SCREW POLYAXIAL 6.5X45MM (Screw) ×2 IMPLANT
SCREW PRECEPT 5.5X45 (Screw) ×1 IMPLANT
SCREW PRECEPT SET (Screw) ×9 IMPLANT
SPONGE GAUZE 4X4 12PLY (GAUZE/BANDAGES/DRESSINGS) ×2 IMPLANT
SPONGE LAP 4X18 X RAY DECT (DISPOSABLE) IMPLANT
STAPLER SKIN PROX WIDE 3.9 (STAPLE) IMPLANT
STRIP CLOSURE SKIN 1/2X4 (GAUZE/BANDAGES/DRESSINGS) ×2 IMPLANT
STRIP VITOSS 25X100X4MM (Neuro Prosthesis/Implant) ×2 IMPLANT
SUT VIC AB 1 CT1 18XBRD ANBCTR (SUTURE) ×4 IMPLANT
SUT VIC AB 1 CT1 8-18 (SUTURE)
SUT VIC AB 2-0 CP2 18 (SUTURE) ×5 IMPLANT
SUT VIC AB 2-0 CT1 18 (SUTURE) ×8 IMPLANT
SUT VIC AB 3-0 SH 8-18 (SUTURE) ×15 IMPLANT
SYR 20ML ECCENTRIC (SYRINGE) ×6 IMPLANT
SYR INSULIN 1ML 31GX6 SAFETY (SYRINGE) IMPLANT
TAPE CLOTH 3X10 TAN LF (GAUZE/BANDAGES/DRESSINGS) ×9 IMPLANT
TOWEL OR 17X24 6PK STRL BLUE (TOWEL DISPOSABLE) ×6 IMPLANT
TOWEL OR 17X26 10 PK STRL BLUE (TOWEL DISPOSABLE) ×6 IMPLANT
TRAY FOLEY CATH 14FRSI W/METER (CATHETERS) ×6 IMPLANT
WATER STERILE IRR 1000ML POUR (IV SOLUTION) ×6 IMPLANT
precept rod 200 mm ×2 IMPLANT
precept screw sz. 4.5 x 45 mm ×4 IMPLANT

## 2012-07-19 NOTE — Transfer of Care (Signed)
Immediate Anesthesia Transfer of Care Note  Patient: Rachel Boone  Procedure(s) Performed: Procedure(s) (LRB) with comments: ANTERIOR LATERAL LUMBAR FUSION 4 LEVELS (N/A) - Lumbar one-two, lumbar two-three, lumbar three-four, lumbar four-five, lumbar five-sacral one anterolateral lumbar fusion with Lumbar one-sacral one posterior instrumentation LUMBAR PERCUTANEOUS PEDICLE SCREW 3 LEVEL (Bilateral) - Lumbar one-two, lumbar two-three, lumbar three-four, lumbar four-five, lumbar five-sacral one anterolateral lumbar fusion with Lumbar one-sacral one posterior instrumentation  Patient Location: PACU  Anesthesia Type:General  Level of Consciousness: sedated and pateint uncooperative  Airway & Oxygen Therapy: Patient Spontanous Breathing and Patient connected to face mask oxygen  Post-op Assessment: Report given to PACU RN, Post -op Vital signs reviewed and stable and Patient moving all extremities X 4  Post vital signs: Reviewed and stable  Complications: No apparent anesthesia complications

## 2012-07-19 NOTE — Preoperative (Signed)
Beta Blockers   Reason not to administer Beta Blockers:Not Applicable 

## 2012-07-19 NOTE — OR Nursing (Signed)
Incision for pedicle screw insertion at 1443.

## 2012-07-19 NOTE — H&P (Signed)
Subjective: Patient is a 61 y.o. 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 Symptomatically she has pain in her low back with pain extending into the right buttock, lateral right side, and anterior right leg. She's been treated with several NSAIDS as well as spinal injections without relief. She is now admitted for a multilevel excellent procedure including L5-S1, L4-5, L3-4, L2-3, and possibly L1-2 XLIF with percutaneous posterior instrumentation.   Past Medical History  Diagnosis Date  . GERD (gastroesophageal reflux disease)   . Hyperlipidemia   . Endocervical polyp   . Mitral regurgitation   . Hammertoe   . Pupil irregular   . Arthritis   . Heart murmur   . Allergy     SEASONAL  . Cataract   . PONV (postoperative nausea and vomiting)   . Hiatal hernia     Past Surgical History  Procedure Date  . Hammer toe surgery   . Pupilloplasty   . Thumb fusion     LEFT  . Colonoscopy   . Eye surgery   . Cataract extraction     bilateral    Prescriptions prior to admission  Medication Sig Dispense Refill  . naproxen (NAPROSYN) 500 MG tablet Take 1 tablet (500 mg total) by mouth 2 (two) times daily with a meal.  180 tablet  1  . pantoprazole (PROTONIX) 40 MG tablet Take 40 mg by mouth daily.      . diphenhydrAMINE (BENADRYL) 50 MG capsule Take 25 mg by mouth every 6 (six) hours as needed.       Allergies  Allergen Reactions  . Other     Anesthesia=Nausea/Vomiting    History  Substance Use Topics  . Smoking status: Former Smoker    Quit date: 09/12/1989  . Smokeless tobacco: Never Used  . Alcohol Use: Yes     Comment: one beer a month     Family History  Problem Relation Age of Onset  . Colon polyps Sister     adenomatous  . Lung cancer Mother   . Diabetes type II Mother   . Hypertension  Brother   . Hypertension Sister   . Diabetes type II Sister   . Heart disease Father   . Colon cancer Neg Hx      Review of Systems A comprehensive review of systems was negative.  Objective: Vital signs in last 24 hours: Temp:  [98.5 F (36.9 C)] 98.5 F (36.9 C) (11/07 0626) Pulse Rate:  [65] 65  (11/07 0626) Resp:  [18] 18  (11/07 0626) BP: (116)/(74) 116/74 mmHg (11/07 0626) SpO2:  [98 %] 98 % (11/07 0626)  EXAM:  Patient is a well-developed well-nourished white female in no acute distress.   Lungs are clear to auscultation , the patient has symmetrical respiratory excursion. Heart has a regular rate and rhythm normal S1 and S2 no murmur.   Abdomen is soft nontender nondistended bowel sounds are present. Extremity examination shows no clubbing cyanosis or edema. Musculoskeletal examination shows she is able flex beyond degrees and is able to extend to 10. Straight leg raising is negative bilaterally. There is no tenderness to palpation over the lumbar spinous process paralumbar musculature.  Motor examination shows 5 over 5 strength in the lower extremities including the iliopsoas quadriceps dorsiflexor extensor hallicus  longus and plantar flexor bilaterally. Sensation is intact to pinprick in  the distal lower extremities. Reflexes are symmetrical bilaterally. No pathologic reflexes are present. Patient has a normal gait and stance.   Data Review:CBC    Component Value Date/Time   WBC 3.9* 07/09/2012 0835   RBC 4.61 07/09/2012 0835   HGB 13.5 07/09/2012 0835   HCT 39.9 07/09/2012 0835   PLT 248 07/09/2012 0835   MCV 86.6 07/09/2012 0835   MCH 29.3 07/09/2012 0835   MCHC 33.8 07/09/2012 0835   RDW 12.9 07/09/2012 0835   LYMPHSABS 1.4 12/27/2011 1043   MONOABS 0.2 12/27/2011 1043   EOSABS 0.1 12/27/2011 1043   BASOSABS 0.0 12/27/2011 1043                          BMET    Component Value Date/Time   NA 140 07/09/2012 0835   K 4.1 07/09/2012 0835   CL 103 07/09/2012 0835    CO2 29 07/09/2012 0835   GLUCOSE 99 07/09/2012 0835   BUN 20 07/09/2012 0835   CREATININE 0.75 07/09/2012 0835   CREATININE 0.77 12/27/2011 1043   CALCIUM 9.5 07/09/2012 0835   GFRNONAA 90* 07/09/2012 0835   GFRAA >90 07/09/2012 0835     Assessment/Plan: Patient with extensive multilevel lumbar degenerative changes with resulting degenerative scoliosis and stenosis is admitted now for a multilevel XLIF procedure and percutaneous posterior instrumentation. I've discussed the nature of her condition and the nature of the surgical procedure with the patient. We've discussed to go to surgery, hospital stay, and recuperation, the need for postoperative immobilization in a lumbar brace, and risks surgery including infection, bleeding, possibly for transfusion, the risk of nerve dysfunction with pain, weakness, numbness, or paresthesias, the risk of failure of the arthrodesis and possibly for further surgery, and anesthetic risks of myocardial infarction, stroke, pneumonia, and death. Understanding this the patient was to proceed with surgery and is admitted for such.    Hewitt Shorts, MD 07/19/2012 6:38 AM

## 2012-07-19 NOTE — OR Nursing (Signed)
XLIF procedure in lateral position complete at 1358, patient moved to stretcher and then repositioned in prone position at 1406.

## 2012-07-19 NOTE — Op Note (Signed)
07/19/2012  5:32 PM  PATIENT:  Rachel Boone  61 y.o. female  PRE-OPERATIVE DIAGNOSIS:  Degenerative lumbar scoliosis,  lumbar spondylosis,  lumbar degenerative disc disease, lumbar neural foraminal stenosis, lumbar radiculopathy  POST-OPERATIVE DIAGNOSIS:  Degenerative lumbar scoliosis,  lumbar spondylosis,  lumbar degenerative disc disease, lumbar neural foraminal stenosis,  lumbar radiculopathy  PROCEDURE:  Procedure(s): ANTERIOR LATERAL LUMBAR FUSION 5 LEVELS: L1-2, L2-3, L3-4, L4-5, and L5-S1 anterior lateral lumbar fusion (XLIF) with Coroent peek interbody implants with Vitoss and infuse, with intraoperative neural muscular monitoring LUMBAR PERCUTANEOUS PEDICLE SCREW 5 LEVEL: L1-S1 percutaneous posterior instrumentation fixation with Precept posterior instrumentation  SURGEON:  Surgeon(s): Hewitt Shorts, MD Maeola Harman, MD Tia Alert, MD  ASSISTANTS: Maeola Harman, M.D. and Marikay Alar, M.D.  ANESTHESIA:   general  EBL:  Total I/O In: 3100 [I.V.:3100] Out: 1319 [Urine:1269; Blood:50]  BLOOD ADMINISTERED:none  COUNT: Correct per nursing staff  DICTATION: Patient was brought to the operating room, placed under general endotracheal anesthesia patient was placed in a left side down lateral decubitus position and secured to the operating room table. The right lateral aspect of the inferior torso was prepped with Betadine soap and solution and draped in a sterile fashion. C-arm fluoroscopic guidance was used throughout the procedure to visualize the disc spaces and vertebra in AP and lateral projections. Numerous adjustments were made due to the patient's scoliosis, translation at multiple levels, and corrections were progressively made as each level was work on. The line at each incision throughout the procedure was infiltrated with local site with epinephrine, prior to skin incision. Neuromuscular monitoring was performed throughout the procedure, to localize the neural  structures relative to are working field, and good positioning of our approach and retractors was confirmed each level throughout the procedure. We first localized the L5-S1 level, incision was made laterally overlying the L5-S1 disc space level in the right lateral midline, a second incision was made posterior to the lateral midline. The retroperitoneal space was entered and a dilator was passed down to the lateral aspect of the spine. We positioned it so that the nerve was behind it and then we passed a K wire into the L5-S1 disc space. A series of dilators was passed over one another, with neural monitoring at each dilator was passed. We then placed the retractor over the dilators, its positioning was checked with C-arm fluoroscopic guidance, and once it was properly positioned, a shim was gently tamped into the disc space. We then examined the lateral aspect of the vertebral column with the neural monitor. We then incised the lateral aspect of the anulus entered into the disc space. Discectomy was performed using pituitary rongeurs and a variety of curettes. We then gently tamped into the disc space with various trial sizers, until a proper implant was selected. The implant itself was packed with a combination of Vitoss and infuse, and then gently tamped into position in the intravertebral disc space, with C-arm fluoroscopic guidance. The retractor was then removed under direct visualization. We then repeated this approach and each of the other levels. A separate incision was made over the L4-5 level. Another incision was used to approach the L3-4 and L2-3 levels. A final incision was made for the L1-2 level. The specific implants used were at L5 S1-1 10 x 18 x 50 x 10 XL, and L4-5-1 10 x 22 x 50 x 10 XLW, it L3-4 at 10 x 18 x 50 x 10 coronal tapered XL, and L2-3 an 8  x 18 x 45 mm XL, and it L1 to 8 8 x 18 x 45 mm XL. Each of the 5 incisions was closed in multiple layers. Deep fascia was closed with  interrupted undyed 2-0 Vicryl sutures, Scarpa's fascia was closed with interrupted undyed 2-0 Vicryl sutures, and the subcutaneous and subcuticular closed with interrupted inverted 3-0 Vicryl sutures. The skin edges were approximated with Dermabond. The patient was then turned to a prone position. We continued to do neuromuscular monitoring throughout this portion of the procedure as well. And again C-arm fluoroscopic guidance was used throughout the procedure. The inferior thoracic and lumbar region was prepped with Betadine soap and solution, and draped in a sterile fashion. Using C-arm fluoroscopic guidance entry points were identified to approach the pedicles at the L1-S1 levels. At each level were screws were placed, an incision was made, and the underlying fascia incised. A Jamshidi needle was positioned in the superolateral quadrant of the pedicle and gently passed in an anteromedial trajectory, with neural monitoring, and C-arm fluoroscopic guidance in AP and lateral projections. Once the needle was within the vertebral body a K wire was passed down the cannulated needle, the needle was removed, with the K wire being left in place. We then passed cannulated screws over the K wire at each level, and the screws were gently screwed down into the pedicle and vertebral body, with C-arm fluoroscopic guidance insuring good positioning. We placed 4.5 x 45 mm screws bilaterally at L1 and on the left side at L2 and L3, a 5.5 x 45 lumbar screw on the right side at L4, 6.5 x 45 mm screws bilaterally at L5, and 6.5 x 40 mm screws bilaterally at S1. Once all the screws were in place we used the Nuvasive rod bending system to contour rods for the patient's deformity. Each rod was prepared and then passed through the screw towers. Once the rod was within all the towers, locking caps were passed down and secured to the screws. Once all the locking caps were in place final tightening of all of them was performed with the  torque wrench and a counter torque. We then proceeded with closure. Deep fascia was closed with interrupted undyed 2-0 Vicryl sutures, Scarpa's fascia was closed with interrupted undyed 2-0 Vicryl sutures, and the subcutaneous and subcuticular closed with interrupted inverted 3-0 undyed Vicryl sutures. Skin is were approximate Dermabond. Following surgery the patient was turned back to a supine position, to be reversed and the anesthetic, extubated if feasible, and transferred to the recovery room for further care, to be subsequently transferred to the neurosurgical intensive care unit for further care.  PLAN OF CARE: Admit to inpatient   PATIENT DISPOSITION:  PACU - hemodynamically stable.   Delay start of Pharmacological VTE agent (>24hrs) due to surgical blood loss or risk of bleeding:  yes    Oh in a

## 2012-07-19 NOTE — Anesthesia Preprocedure Evaluation (Addendum)
Anesthesia Evaluation  Patient identified by MRN, date of birth, ID band Patient awake    Reviewed: Allergy & Precautions, H&P , NPO status , Patient's Chart, lab work & pertinent test results  History of Anesthesia Complications (+) PONV  Airway Mallampati: II TM Distance: >3 FB Neck ROM: full    Dental  (+) Teeth Intact, Dental Advidsory Given and Caps   Pulmonary          Cardiovascular     Neuro/Psych    GI/Hepatic hiatal hernia, GERD-  Medicated and Controlled,  Endo/Other    Renal/GU      Musculoskeletal   Abdominal   Peds  Hematology   Anesthesia Other Findings   Reproductive/Obstetrics                           Anesthesia Physical Anesthesia Plan  ASA: II  Anesthesia Plan: General ETT and General   Post-op Pain Management:    Induction: Intravenous  Airway Management Planned: Oral ETT  Additional Equipment:   Intra-op Plan:   Post-operative Plan: Extubation in OR  Informed Consent: I have reviewed the patients History and Physical, chart, labs and discussed the procedure including the risks, benefits and alternatives for the proposed anesthesia with the patient or authorized representative who has indicated his/her understanding and acceptance.   Dental Advisory Given  Plan Discussed with: CRNA and Surgeon  Anesthesia Plan Comments:        Anesthesia Quick Evaluation

## 2012-07-19 NOTE — Anesthesia Postprocedure Evaluation (Signed)
  Anesthesia Post-op Note  Patient: Rachel Boone  Procedure(s) Performed: Procedure(s) (LRB) with comments: ANTERIOR LATERAL LUMBAR FUSION 4 LEVELS (N/A) - Lumbar one-two, lumbar two-three, lumbar three-four, lumbar four-five, lumbar five-sacral one anterolateral lumbar fusion with Lumbar one-sacral one posterior instrumentation LUMBAR PERCUTANEOUS PEDICLE SCREW 3 LEVEL (Bilateral) - Lumbar one-two, lumbar two-three, lumbar three-four, lumbar four-five, lumbar five-sacral one anterolateral lumbar fusion with Lumbar one-sacral one posterior instrumentation  Patient Location: PACU  Anesthesia Type:General  Level of Consciousness: sedated, patient cooperative and responds to stimulation and voice  Airway and Oxygen Therapy: Patient Spontanous Breathing and Patient connected to nasal cannula oxygen  Post-op Pain: mild  Post-op Assessment: Post-op Vital signs reviewed, Patient's Cardiovascular Status Stable, Respiratory Function Stable, Patent Airway, No signs of Nausea or vomiting and Pain level controlled  Post-op Vital Signs: Reviewed and stable  Complications: No apparent anesthesia complications

## 2012-07-19 NOTE — Progress Notes (Signed)
Dr. Newell Coral updated by Arn Medal RN. Notified that pt. has purposeful movements, resting at times, when she wakes up becomes restless and moving from one side to the other, goes back to sleep after dilaudid given. Still awaiting for room assignment.

## 2012-07-19 NOTE — Progress Notes (Signed)
Pt complain of being nauseous. No antimeric ordered. Notified Dr. Venetia Maxon; Zofran and Phenergan ordered PRN. Will continue to monitor.

## 2012-07-19 NOTE — Anesthesia Procedure Notes (Signed)
Procedure Name: Intubation Date/Time: 07/19/2012 7:45 AM Performed by: Carmela Rima Pre-anesthesia Checklist: Patient identified, Timeout performed, Emergency Drugs available, Suction available and Patient being monitored Patient Re-evaluated:Patient Re-evaluated prior to inductionOxygen Delivery Method: Circle system utilized Preoxygenation: Pre-oxygenation with 100% oxygen Intubation Type: IV induction and Rapid sequence Ventilation: Mask ventilation without difficulty Laryngoscope Size: Mac and 3 Grade View: Grade I Tube type: Oral Tube size: 7.5 mm Number of attempts: 1 Placement Confirmation: ETT inserted through vocal cords under direct vision,  positive ETCO2 and breath sounds checked- equal and bilateral Secured at: 21 cm Tube secured with: Tape Dental Injury: Teeth and Oropharynx as per pre-operative assessment

## 2012-07-20 MED ORDER — MORPHINE SULFATE (PF) 1 MG/ML IV SOLN
INTRAVENOUS | Status: DC
  Administered 2012-07-20: 6 mg via INTRAVENOUS
  Administered 2012-07-20: 20:00:00 via INTRAVENOUS
  Administered 2012-07-20: 6 mg via INTRAVENOUS
  Administered 2012-07-20: 09:00:00 via INTRAVENOUS
  Administered 2012-07-21: 4.9 mg via INTRAVENOUS
  Administered 2012-07-21: 8.5 mg via INTRAVENOUS
  Administered 2012-07-21: 9.69 mg via INTRAVENOUS
  Filled 2012-07-20 (×2): qty 25

## 2012-07-20 MED ORDER — DIPHENHYDRAMINE HCL 50 MG/ML IJ SOLN: 12.5000 mg | Freq: Four times a day (QID) | INTRAMUSCULAR | Status: DC | PRN

## 2012-07-20 MED ORDER — DIPHENHYDRAMINE HCL 12.5 MG/5ML PO ELIX
12.5000 mg | ORAL_SOLUTION | Freq: Four times a day (QID) | ORAL | Status: DC | PRN
  Administered 2012-07-20: 12.5 mg via ORAL
  Filled 2012-07-20: qty 5

## 2012-07-20 MED ORDER — ONDANSETRON HCL 4 MG/2ML IJ SOLN: 4.0000 mg | Freq: Four times a day (QID) | INTRAMUSCULAR | Status: DC | PRN

## 2012-07-20 MED ORDER — NALOXONE HCL 0.4 MG/ML IJ SOLN: 0.4000 mg | INTRAMUSCULAR | Status: DC | PRN

## 2012-07-20 MED ORDER — SODIUM CHLORIDE 0.9 % IJ SOLN: 9.0000 mL | INTRAMUSCULAR | Status: DC | PRN

## 2012-07-20 MED ORDER — MORPHINE SULFATE 4 MG/ML IJ SOLN
4.0000 mg | INTRAMUSCULAR | Status: DC | PRN
  Filled 2012-07-20 (×2): qty 1

## 2012-07-20 NOTE — Progress Notes (Signed)
Late entry  Patient seen in PACU, multiple times following surgery. Restless, but moving all 4 extremities. Wound is clean and dry. Vital signs stable. To transfer to neurosurgical ICU once recovered from anesthesia. Met with the patient's family (sister and 2 brothers) and discussed nature surgery and postoperative condition. Their questions were answered for them.

## 2012-07-20 NOTE — Progress Notes (Signed)
Subjective: Patient with moderate pain and discomfort, mostly in the lower lumbar region. Nursing staff reports persistent pain despite current pain management. Very little by mouth intake so far, continues on IV fluids.  Objective: Vital signs in last 24 hours: Filed Vitals:   07/20/12 0400 07/20/12 0500 07/20/12 0600 07/20/12 0700  BP: 114/62 106/59 102/83 109/55  Pulse: 74 78 86 71  Temp:      TempSrc:      Resp: 12 18 17 11   Height:      Weight:      SpO2: 98% 99% 98% 96%    Intake/Output from previous day: 11/07 0701 - 11/08 0700 In: 4650 [I.V.:4350; IV Piggyback:300] Out: 2459 [Urine:2409; Blood:50] Intake/Output this shift:    Physical Exam:  Wounds clean and dry, moving all 4 extremities well.   Studies/Results: Dg Lumbar Spine Complete  07/19/2012  *RADIOLOGY REPORT*  Clinical Data: Lumbar disc disease.  LUMBAR SPINE - COMPLETE 4+ VIEW,DG C-ARM GT 120 MIN  Comparison: None.  Findings: Multiple c-arm images demonstrate the patient has undergone interbody and posterior fusion from L1-S1.  Pedicle screws, posterior rods and interbody fusion devices are present at the operative levels.  IMPRESSION: Fusion performed from L1-S1.  S1 appears to be lumbarized.   Original Report Authenticated By: Francene Boyers, M.D.    Dg C-arm Gt 120 Min  07/19/2012  *RADIOLOGY REPORT*  Clinical Data: Lumbar disc disease.  LUMBAR SPINE - COMPLETE 4+ VIEW,DG C-ARM GT 120 MIN  Comparison: None.  Findings: Multiple c-arm images demonstrate the patient has undergone interbody and posterior fusion from L1-S1.  Pedicle screws, posterior rods and interbody fusion devices are present at the operative levels.  IMPRESSION: Fusion performed from L1-S1.  S1 appears to be lumbarized.   Original Report Authenticated By: Francene Boyers, M.D.     Assessment/Plan: Discussed patient's condition and the treatment care with the patient and her nurses. We'll add PCA morphine to help with pain management. Have  encouraged patient nursing staff to mobilize today to chair, and to ambulate if feasible. Will leave Foley in and continue IV fluids for now.   Hewitt Shorts, MD 07/20/2012, 8:02 AM

## 2012-07-20 NOTE — Evaluation (Signed)
Physical Therapy Evaluation Patient Details Name: Rachel Boone MRN: [id number] DOB: [birth year] Today's Date: 07/20/2012 Time: 4098-1191 PT Time Calculation (min): 20 min  PT Assessment / Plan / Recommendation Clinical Impression  Pt is 61 y/o female admitted for s/p multi level ALIF L1-S1.  Limited evaluation performed due to rapid decrease in BP sitting EOB and symptomatic with diaphrosesis and near syncope episode.  Pt was returned to supine in trendelenburg position.  BP listed below.  Pt will benefit from acute PT services to improve overall mobility to prepare for safe d/c home.    PT Assessment  Patient needs continued PT services    Follow Up Recommendations  Home health PT (depending on progression; will continue to assess need)    Does the patient have the potential to tolerate intense rehabilitation      Barriers to Discharge None      Equipment Recommendations  Rolling walker with 5" wheels (TBD)    Recommendations for Other Services     Frequency Min 5X/week    Precautions / Restrictions Precautions Precautions: Back Required Braces or Orthoses: Spinal Brace Spinal Brace: Lumbar corset Restrictions Weight Bearing Restrictions: No   Pertinent Vitals/Pain 20/10 when returned to bed; Pt was asked about pain prior to mobility and pt stated "No pain."  Apparently pt was being sarcastic and stated "Well I'm doing this without any pain medication."  Pt was asked about pain and again pt stated "no pain" prior to mobility.  Pt's BP decreased to 63/49 upon sitting EOB- pt became dizzy and diaphoretic. Returned pt to supine and placed where BP increased. RN informed        Mobility  Bed Mobility Bed Mobility: Rolling Left;Rolling Right;Left Sidelying to Sit;Sitting - Scoot to [place] of Bed;Scooting to [facility] Rolling Right: 4: Min assist;With rail Rolling Left: 4: Min assist;With rail Left Sidelying to Sit: 3: Mod assist;With rails;HOB flat Sitting - Scoot to [place] of  Bed: 4: Min guard Sit to Sidelying Left: 1: +2 Total assist;HOB flat Sit to Sidelying Left: Patient Percentage: 50% Scooting to [facility]: With rail Details for Bed Mobility Assistance: VC for hand placement, sequencing and log rolling technique. Pt restless and required multiple cues to maintain back precautions Transfers Transfers: Not assessed Ambulation/Gait Ambulation/Gait Assistance: Not tested (comment)    Shoulder Instructions     Exercises     PT Diagnosis: Difficulty walking;Generalized weakness;Acute pain  PT Problem List: Decreased strength;Decreased range of motion;Decreased activity tolerance;Decreased mobility;Decreased balance;Decreased knowledge of use of DME;Decreased knowledge of precautions;Pain PT Treatment Interventions: DME instruction;Gait training;Stair training;Functional mobility training;Therapeutic activities;Therapeutic exercise;Balance training;Patient/family education   PT Goals Acute Rehab PT Goals PT Goal Formulation: With patient Time For Goal Achievement: 07/27/12 Potential to Achieve Goals: Good Pt will Roll Supine to Left Side: with modified independence PT Goal: Rolling Supine to Left Side - Progress: Goal set today Pt will go Supine/Side to Sit: with modified independence PT Goal: Supine/Side to Sit - Progress: Goal set today Pt will Sit at Edge of Bed: with modified independence PT Goal: Sit at [place] Of Bed - Progress: Goal set today Pt will go Sit to Supine/Side: with modified independence PT Goal: Sit to Supine/Side - Progress: Goal set today Pt will go Sit to Stand: with modified independence PT Goal: Sit to Stand - Progress: Goal set today Pt will go Stand to Sit: with modified independence PT Goal: Stand to Sit - Progress: Goal set today Pt will Ambulate: >150 feet;with modified independence;with rolling walker PT Goal:  Ambulate - Progress: Goal set today Pt will Go Up / Down Stairs: Flight;with min assist;with least restrictive assistive  device PT Goal: Up/Down Stairs - Progress: Goal set today  Visit Information  Last PT Received On: 07/20/12 Assistance Needed: +2 (for safety)    Subjective Data  Subjective: "I have no pain." (Pt reported no pain while lying in bed prior to mobility. ) Patient Stated Goal: To return home   Prior Functioning  Home Living Lives With: Other (Comment) Arline Asp) Available Help at Discharge: Family Type of Home: House Home Access: Stairs to enter [employer] of Steps: 12 Entrance Stairs-Rails: Left Home Layout: One level Bathroom Shower/Tub: Tub/shower unit;Walk-in shower;Curtain Bathroom Toilet: Standard Bathroom Accessibility: Yes How Accessible: Accessible via walker Home Adaptive Equipment: Straight cane Prior Function Level of Independence: Independent Able to Take Stairs?: Yes Driving: Yes Vocation:  (build buildings) Communication Communication: No difficulties Dominant Hand: Right    Cognition  Overall Cognitive Status: Appears within functional limits for tasks assessed/performed Arousal/Alertness: Awake/alert Orientation Level: Appears intact for tasks assessed Behavior During Session: Restless    Extremity/Trunk Assessment Right Upper Extremity Assessment RUE ROM/Strength/Tone: WFL for tasks assessed Left Upper Extremity Assessment LUE ROM/Strength/Tone: WFL for tasks assessed Right Lower Extremity Assessment RLE ROM/Strength/Tone: Unable to fully assess;Due to pain Left Lower Extremity Assessment LLE ROM/Strength/Tone: Unable to fully assess;Due to pain   Balance Balance Balance Assessed: Yes Static Sitting Balance Static Sitting - Balance Support: Feet supported Static Sitting - Level of Assistance: 4: Min assist Static Sitting - Comment/# of Minutes: ~5 minutes prior to return to supine in trendelenburg position due rapid decrease in BP.  End of Session PT - End of Session Activity Tolerance: Treatment limited secondary to medical complications  (Comment) (Rapid decrease in BP) Patient left: in bed;with call bell/phone within reach;with family/visitor present Nurse Communication: Mobility status;Precautions  GP     Jabarri Stefanelli 07/20/2012, 2:31 PM Jake Shark, PT DPT [phone]

## 2012-07-20 NOTE — Evaluation (Signed)
Occupational Therapy Evaluation Patient Details Name: Rachel Boone MRN: [id number] DOB: 12-13-1950 Today's Date: 07/20/2012 Time: 7829-5621 OT Time Calculation (min): 26 min  OT Assessment / Plan / Recommendation Clinical Impression  Pt s/p L1-2, L2-3, L3-4, L4-5, and L5-S1 anterior lateral lumbar fusion. Eval limited today as pt with orthostatic hypotension upon supine to sit. Pt will benefit from skilled OT in the acute setting to maximize I with ADL and ADL mobility prior to d/c. Suspect pt will progress well enough to return home with 24hr assist upon d/c.    OT Assessment  Patient needs continued OT Services    Follow Up Recommendations  Home health OT;Supervision/Assistance - 24 hour    Barriers to Discharge      Equipment Recommendations   (TBD)    Recommendations for Other Services    Frequency  Min 2X/week    Precautions / Restrictions Precautions Precautions: Back   Pertinent Vitals/Pain Pt initially denied pain, but reports "20/10" during mobility. Pt's BP decreased to 63/49 upon sitting EOB- pt became dizzy and diaphoretic. Returned pt to supine and placed where BP increased. RN informed    ADL  Grooming: Min guard (pt required at least unilateral support for sitting balance) Where Assessed - Grooming: Supported sitting Upper Body Bathing: Minimal assistance Where Assessed - Upper Body Bathing: Supported sitting Upper Body Dressing: Minimal assistance Where Assessed - Upper Body Dressing: Unsupported sitting Transfers/Ambulation Related to ADLs: Unable to test due to orthostatic hypotension ADL Comments: limited eval but suspect pt will do well enough to d/c home with 24hr assist    OT Diagnosis: Generalized weakness;Acute pain;Disturbance of vision  OT Problem List: Decreased activity tolerance;Impaired balance (sitting and/or standing);Impaired vision/perception;Decreased knowledge of use of DME or AE;Decreased knowledge of precautions;Cardiopulmonary status  limiting activity;Pain OT Treatment Interventions: Self-care/ADL training;DME and/or AE instruction;Therapeutic activities;Visual/perceptual remediation/compensation;Patient/family education;Balance training   OT Goals Acute Rehab OT Goals OT Goal Formulation: With patient Time For Goal Achievement: 07/27/12 Potential to Achieve Goals: Good ADL Goals Pt Will Perform Grooming: Independently;Standing at sink ADL Goal: Grooming - Progress: Goal set today Pt Will Perform Upper Body Dressing: Independently;Sitting, chair;Sitting, bed ADL Goal: Upper Body Dressing - Progress: Goal set today Pt Will Perform Lower Body Dressing: with modified independence;Sit to stand from bed;Sit to stand from chair ADL Goal: Lower Body Dressing - Progress: Goal set today Pt Will Transfer to Toilet: with modified independence;Ambulation;with DME ADL Goal: Toilet Transfer - Progress: Goal set today Pt Will Perform Toileting - Clothing Manipulation: Independently;Standing ADL Goal: Toileting - Clothing Manipulation - Progress: Goal set today Pt Will Perform Toileting - Hygiene: with modified independence;Sit to stand from 3-in-1/toilet ADL Goal: Toileting - Hygiene - Progress: Goal set today Pt Will Perform Tub/Shower Transfer: Shower transfer;with modified independence;Ambulation ADL Goal: Tub/Shower Transfer - Progress: Goal set today Additional ADL Goal #1: Pt will verbalize/generalize 3/3 back precautions for use with functional activities.  ADL Goal: Additional Goal #1 - Progress: Goal set today Additional ADL Goal #2: Pt will I'ly don/doff back brace at appropriate times. ADL Goal: Additional Goal #2 - Progress: Goal set today  Visit Information  Last OT Received On: 07/20/12 Assistance Needed: +1    Subjective Data  Subjective: I'm doing all this without pain medicine Patient Stated Goal: Return home   Prior Functioning     Home Living Lives With: Other (Comment) Arline Asp) Available Help at  Discharge: Family Type of Home: House Home Access: Stairs to enter [employer] of Steps: 12 Entrance Stairs-Rails: Left  Home Layout: One level Bathroom Shower/Tub: Tub/shower unit;Walk-in shower;Curtain Bathroom Toilet: Standard Bathroom Accessibility: Yes How Accessible: Accessible via walker Home Adaptive Equipment: Straight cane Prior Function Level of Independence: Independent Able to Take Stairs?: Yes Driving: Yes Vocation:  (build buildings) Communication Communication: No difficulties Dominant Hand: Right         Vision/Perception Vision - Assessment Additional Comments: did not assess vision this session. Will assess next session if blurry vision still present   Cognition  Overall Cognitive Status: Appears within functional limits for tasks assessed/performed Arousal/Alertness: Awake/alert Orientation Level: Appears intact for tasks assessed Behavior During Session: [facility] for tasks performed    Extremity/Trunk Assessment Right Upper Extremity Assessment RUE ROM/Strength/Tone: [facility] for tasks assessed Left Upper Extremity Assessment LUE ROM/Strength/Tone: [facility] for tasks assessed     Mobility Bed Mobility Bed Mobility: Rolling Left;Rolling Right;Left Sidelying to Sit;Sitting - Scoot to [place] of Bed;Scooting to [facility] Rolling Right: 4: Min assist;With rail Rolling Left: 4: Min assist;With rail Left Sidelying to Sit: 3: Mod assist;With rails;HOB flat Sitting - Scoot to [place] of Bed: 4: Min guard Scooting to [facility]: With rail Details for Bed Mobility Assistance: VC for hand placement, sequencing and log rolling technique. Pt restless and required multiple cues to maintain back precautions     Shoulder Instructions     Exercise     Balance     End of Session OT - End of Session Equipment Utilized During Treatment: Gait belt Activity Tolerance: Patient limited by pain (and orthostatic hypotension) Patient left: in bed;with call bell/phone within reach Nurse  Communication: Mobility status (pt orthostatic)  GO     Paco Cislo 07/20/2012, 1:23 PM

## 2012-07-20 NOTE — Progress Notes (Signed)
UR COMPLETED  

## 2012-07-21 MED ORDER — OXYCODONE-ACETAMINOPHEN 5-325 MG PO TABS
1.0000 | ORAL_TABLET | ORAL | Status: DC | PRN
  Administered 2012-07-21 – 2012-07-23 (×10): 2 via ORAL
  Filled 2012-07-21 (×10): qty 2

## 2012-07-21 NOTE — Progress Notes (Signed)
Subjective: Patient sitting up in chair, remarkably comfortable. Notes good relief of right lumbar radicular pain. Wounds reported the nurse to be clean and dry, and healing well.  Objective: Vital signs in last 24 hours: Filed Vitals:   07/21/12 0400 07/21/12 0500 07/21/12 0600 07/21/12 0700  BP: 123/70 124/67 107/52 106/59  Pulse: 99 93 92 93  Temp: 99.7 F (37.6 C)     TempSrc: Oral     Resp: 21 14 11 11   Height:      Weight:      SpO2: 95% 95% 94% 92%    Intake/Output from previous day: 11/08 0701 - 11/09 0700 In: 3700 [P.O.:600; I.V.:3000; IV Piggyback:100] Out: 1990 [Urine:1990] Intake/Output this shift:    Physical Exam:  Moving all 4 extremities well.   Assessment/Plan: We'll progressively mobilize today. Have spoken with the patient and her nurse about ambulating at least 4 times a day in the ICU. If she does well with initial ambulation we will discontinue Foley and PCA.   Hewitt Shorts, MD 07/21/2012, 7:49 AM

## 2012-07-21 NOTE — Progress Notes (Signed)
Physical Therapy Treatment Patient Details Name: Rachel Boone MRN: [id number] DOB: [birth year] Today's Date: 07/21/2012 Time: 0810-0827 PT Time Calculation (min): 17 min  PT Assessment / Plan / Recommendation Comments on Treatment Session  Pt moving well this session. BP still low although pt asymptomatic, RN states that it was okay to work with pt. Pt educated on the importance of safety to maintain back precautions at all times, including resting positions. Pt with increased pain in upright sitting and has been lying in the recliner. Continue per plan, attempt stairs tomorrow pending pt willingness and medical stability    Follow Up Recommendations  Home health PT     Does the patient have the potential to tolerate intense rehabilitation     Barriers to Discharge        Equipment Recommendations  Rolling walker with 5" wheels    Recommendations for Other Services    Frequency Min 5X/week   Plan Discharge plan remains appropriate;Frequency remains appropriate    Precautions / Restrictions Precautions Precautions: Back Precaution Booklet Issued: Yes (comment) Precaution Comments: pt educated on 3/3 back precautions Required Braces or Orthoses: Spinal Brace Spinal Brace: Lumbar corset Restrictions Weight Bearing Restrictions: No   Pertinent Vitals/Pain Pain 6/10 in back during sitting. RN aware.     Mobility  Bed Mobility Bed Mobility: Not assessed Transfers Transfers: Sit to Stand;Stand to Sit Sit to Stand: 5: Supervision;With upper extremity assist;From chair/3-in-1 Stand to Sit: 5: Supervision;With upper extremity assist;To chair/3-in-1 Details for Transfer Assistance: VC for safe hand placement to/from RW. Pt also given cueing for safe technique to maintain back precautions Ambulation/Gait Ambulation/Gait Assistance: 4: Min guard Ambulation Distance (Feet): 150 Feet Assistive device: Rolling walker Ambulation/Gait Assistance Details: VC throughout for upright  posture and safe technique with RW. Pt with good stride throughout Gait Pattern: Within Functional Limits Gait velocity: normal gait speed Stairs: No    Exercises     PT Diagnosis:    PT Problem List:   PT Treatment Interventions:     PT Goals Acute Rehab PT Goals PT Goal: Sit to Stand - Progress: Progressing toward goal PT Goal: Stand to Sit - Progress: Progressing toward goal PT Goal: Ambulate - Progress: Progressing toward goal  Visit Information  Last PT Received On: 07/21/12 Assistance Needed: +1    Subjective Data      Cognition  Overall Cognitive Status: Appears within functional limits for tasks assessed/performed Arousal/Alertness: Awake/alert Orientation Level: Appears intact for tasks assessed Behavior During Session: [facility] for tasks performed    Balance     End of Session PT - End of Session Equipment Utilized During Treatment: Back brace;Gait belt Activity Tolerance: Patient tolerated treatment well Patient left: in chair;with call bell/phone within reach;with nursing in room;with family/visitor present Nurse Communication: Mobility status;Precautions   GP     Milana Kidney 07/21/2012, 8:45 AM

## 2012-07-22 MED ORDER — NABUMETONE 500 MG PO TABS
500.0000 mg | ORAL_TABLET | Freq: Two times a day (BID) | ORAL | Status: DC
  Administered 2012-07-22 – 2012-07-23 (×3): 500 mg via ORAL
  Filled 2012-07-22 (×4): qty 1

## 2012-07-22 NOTE — Plan of Care (Signed)
Problem: Phase I Progression Outcomes Goal: Initial discharge plan identified Outcome: Completed/Met Date Met:  07/22/12 11/11

## 2012-07-22 NOTE — Progress Notes (Signed)
Physical Therapy Treatment Patient Details Name: Rachel Boone MRN: [id number] DOB: 02/02/1951 Today's Date: 07/22/2012 Time: 4098-1191 PT Time Calculation (min): 23 min  PT Assessment / Plan / Recommendation Comments on Treatment Session  Pt progressing slowly, although still limited by decreased BP with mobility. After completing stairs, pt felt lightheaded and clammy therefore RN requested and BP taken(99/53). Pt rolled back to bed. Continue per plan pending pt medical stability    Follow Up Recommendations  Home health PT     Does the patient have the potential to tolerate intense rehabilitation     Barriers to Discharge        Equipment Recommendations  Rolling walker with 5" wheels    Recommendations for Other Services    Frequency Min 5X/week   Plan Discharge plan remains appropriate;Frequency remains appropriate    Precautions / Restrictions Precautions Precautions: Back Precaution Comments: Pt required max cueing for safety to maintain back precautions Required Braces or Orthoses: Spinal Brace Spinal Brace: Lumbar corset Restrictions Weight Bearing Restrictions: No   Pertinent Vitals/Pain Pain 7/10.     Mobility  Bed Mobility Bed Mobility: Sit to Sidelying Right Sit to Sidelying Right: 4: Min guard Details for Bed Mobility Assistance: Max cueing for safe technique back into bed. Pt twisting during transfer into supine Transfers Transfers: Sit to Stand;Stand to Sit Sit to Stand: 5: Supervision;With upper extremity assist;From chair/3-in-1 Stand to Sit: 5: Supervision;With upper extremity assist;To chair/3-in-1 Details for Transfer Assistance: VC for safe hand placement to/from RW. Pt also given cueing for safe technique to maintain back precautions Ambulation/Gait Ambulation/Gait Assistance: 4: Min guard Ambulation Distance (Feet): 100 Feet Assistive device: Rolling walker Ambulation/Gait Assistance Details: Pt with good ambulation this session although  following stairs, pt clammy and lighteaded and was rolled back to room Gait Pattern: Within Functional Limits Gait velocity: normal gait speed Stairs: Yes Stairs Assistance: 4: Min guard Stair Management Technique: One rail Left;Two rails;Step to pattern;Forwards Number of Stairs: 10     Exercises     PT Diagnosis:    PT Problem List:   PT Treatment Interventions:     PT Goals Acute Rehab PT Goals PT Goal: Rolling Supine to Left Side - Progress: Progressing toward goal PT Goal: Supine/Side to Sit - Progress: Progressing toward goal PT Goal: Sit at Edge Of Bed - Progress: Met PT Goal: Sit to Supine/Side - Progress: Progressing toward goal PT Goal: Sit to Stand - Progress: Progressing toward goal PT Goal: Stand to Sit - Progress: Progressing toward goal PT Goal: Ambulate - Progress: Progressing toward goal PT Goal: Up/Down Stairs - Progress: Met  Visit Information  Last PT Received On: 07/22/12 Assistance Needed: +1    Subjective Data      Cognition  Overall Cognitive Status: Appears within functional limits for tasks assessed/performed Arousal/Alertness: Awake/alert Orientation Level: Appears intact for tasks assessed Behavior During Session: [facility] for tasks performed    Balance     End of Session PT - End of Session Equipment Utilized During Treatment: Back brace;Gait belt Activity Tolerance: Treatment limited secondary to medical complications (Comment) (pt lightheaded and decreased BP) Patient left: in bed;with call bell/phone within reach;with nursing in room;with family/visitor present Nurse Communication: Mobility status   GP     Milana Kidney 07/22/2012, 2:34 PM

## 2012-07-22 NOTE — Progress Notes (Signed)
Patient 61 y/o female transferred from 3100 via wheelchair. AOx 4. Incisions to back and right lateral intact. Patient made comfortable in bed. Family at bedside.

## 2012-07-22 NOTE — Progress Notes (Signed)
Filed Vitals:   07/22/12 0222 07/22/12 0330 07/22/12 0400 07/22/12 0800  BP: 113/62  125/71 118/69  Pulse:      Temp: 99 F (37.2 C)  97.3 F (36.3 C) 97.8 F (36.6 C)  TempSrc: Oral  Oral Oral  Resp:  15 11 16   Height:      Weight:      SpO2:   97% 96%    Patient resting in bed, more discomfort overnight, but IV Tylenol and IV Toradol have finished. Wounds clean and dry, mild bruising, and overall appearance is good. Moving all 4 extremity is well.  Plan: We'll add Relafen 500 mg twice a day, transfer to 4 N., and have patient shower today and when necessary. Encouraged patient to ambulate multiple times a day in the halls.  Hewitt Shorts, MD 07/22/2012, 8:47 AM

## 2012-07-23 ENCOUNTER — Encounter (HOSPITAL_COMMUNITY): Payer: Self-pay | Admitting: Neurosurgery

## 2012-07-23 MED ORDER — OXYCODONE-ACETAMINOPHEN 5-325 MG PO TABS: 1.0000 | ORAL_TABLET | ORAL | Status: DC | PRN

## 2012-07-23 MED ORDER — CYCLOBENZAPRINE HCL 10 MG PO TABS: 10.0000 mg | ORAL_TABLET | Freq: Three times a day (TID) | ORAL | Status: DC | PRN

## 2012-07-23 NOTE — Discharge Summary (Signed)
Physician Discharge Summary  Patient ID: NIL BOLSER MRN: [id number] DOB/AGE: 12/01/1950 61 y.o.  Admit date: 07/19/2012 Discharge date: 07/23/2012  Admission Diagnoses:  Degenerative lumbar scoliosis, lumbar spondylosis, lumbar degenerative disc disease, lumbar neural foraminal stenosis, lumbar radiculopathy  Discharge Diagnoses:  Degenerative lumbar scoliosis, lumbar spondylosis, lumbar degenerative disc disease, lumbar neural foraminal stenosis, lumbar radiculopathy  Discharged Condition: good  Hospital Course: Patient was admitted underwent 5 level LI-2, L2-3, L3-4, L4-5, and L5-S1 anterior lateral lumbar fusion (XLIF) and L1-S1 percutaneous posterior instrumentation fixation. Postoperatively the patient has done well. She's had a varying amount of pain, but has been able to progressively mobilize. Her incisions is been healing nicely. She's been seen in consultation by both physical therapy and occupational therapy, and has made steady progress. She is up and walking with a rolling walker. She is asking to be discharged to home. She's been given instructions regarding wound care and activities following discharge. She is to return for followup with me in 3 weeks. She's not yet had a bowel movement, but she's been counseled regarding a variety of OTC treatments for constipation.  Discharge Exam: Blood pressure 113/70, pulse 76, temperature 98 F (36.7 C), temperature source Oral, resp. rate 16, height 5\' 7"  (1.702 m), weight 72.3 kg (159 lb 6.3 oz), SpO2 95.00%.  Disposition: Home     Medication List     As of 07/23/2012  8:42 AM    TAKE these medications         cyclobenzaprine 10 MG tablet   Commonly known as: FLEXERIL   Take 1 tablet (10 mg total) by mouth 3 (three) times daily as needed for muscle spasms.      diphenhydrAMINE 50 MG capsule   Commonly known as: BENADRYL   Take 25 mg by mouth every 6 (six) hours as needed.      naproxen 500 MG tablet   Commonly known as:  NAPROSYN   Take 1 tablet (500 mg total) by mouth 2 (two) times daily with a meal.      oxyCODONE-acetaminophen 5-325 MG per tablet   Commonly known as: PERCOCET/ROXICET   Take 1-2 tablets by mouth every 4 (four) hours as needed for pain.      pantoprazole 40 MG tablet   Commonly known as: PROTONIX   Take 40 mg by mouth daily.         Signed: Hewitt Shorts, MD 07/23/2012, 8:42 AM

## 2012-07-23 NOTE — Care Management Note (Signed)
    Page 1 of 1   07/23/2012     12:40:49 PM   CARE MANAGEMENT NOTE 07/23/2012  Patient:  Rachel Boone, Rachel Boone   Account Number:  [id number]  Date Initiated:  07/23/2012  Documentation initiated by:  [facility]  Subjective/Objective Assessment:   Admitted postop 5 level xlif L1-S1.     Action/Plan:   PT/OT evals   Anticipated DC Date:  07/23/2012   Anticipated DC Plan:  HOME/SELF CARE         Choice offered to / List presented to:             Status of service:  Completed, signed off Medicare Important Message given?   (If response is "NO", the following Medicare IM given date fields will be blank) Date Medicare IM given:   Date Additional Medicare IM given:    Discharge Disposition:  HOME/SELF CARE  Per UR Regulation:  Reviewed for med. necessity/level of care/duration of stay  If discussed at Long Length of Stay Meetings, dates discussed:    Comments:  07/23/12 No orders for [facility] or equipment. Patient d/c 'd to home. Jacquelynn Cree RN, BSN, CCM

## 2012-07-23 NOTE — Progress Notes (Signed)
Pt doing well. Pt given D/C instructions with Rx, verbal understanding given. Pt D/C'd home via wheelchair @ 1030 per MD order. Rema Fendt, RN

## 2012-10-27 ENCOUNTER — Other Ambulatory Visit: Payer: Self-pay

## 2013-07-18 ENCOUNTER — Other Ambulatory Visit: Payer: Self-pay

## 2013-10-21 ENCOUNTER — Other Ambulatory Visit (HOSPITAL_COMMUNITY): Payer: Self-pay | Admitting: Orthopedic Surgery

## 2013-10-21 ENCOUNTER — Encounter (HOSPITAL_COMMUNITY)
Admission: RE | Admit: 2013-10-21 | Discharge: 2013-10-21 | Disposition: A | Discharge status: Home / Self Care | Payer: 59 | Source: Ambulatory Visit | Attending: Orthopedic Surgery | Admitting: Orthopedic Surgery

## 2013-10-21 ENCOUNTER — Encounter (HOSPITAL_COMMUNITY): Payer: Self-pay

## 2013-10-21 HISTORY — DX: Family history of other specified conditions: Z84.89

## 2013-10-21 LAB — COMPREHENSIVE METABOLIC PANEL
ALT: 12 U/L (ref 0–35)
AST: 22 U/L (ref 0–37)
Albumin: 3.9 g/dL (ref 3.5–5.2)
Alkaline Phosphatase: 61 U/L (ref 39–117)
BUN: 15 mg/dL (ref 6–23)
CO2: 28 mEq/L (ref 19–32)
CREATININE: 0.87 mg/dL (ref 0.50–1.10)
Calcium: 9.4 mg/dL (ref 8.4–10.5)
Chloride: 102 mEq/L (ref 96–112)
GFR, EST AFRICAN AMERICAN: 80 mL/min — AB (ref >90)
GFR, EST NON AFRICAN AMERICAN: 69 mL/min — AB (ref >90)
GLUCOSE: 74 mg/dL (ref 70–99)
Potassium: 4.4 mEq/L (ref 3.7–5.3)
Sodium: 142 mEq/L (ref 137–147)
Total Bilirubin: <0.2 mg/dL — ABNORMAL LOW (ref 0.3–1.2)
Total Protein: 7.5 g/dL (ref 6.0–8.3)

## 2013-10-21 LAB — CBC
HCT: 39.8 % (ref 36.0–46.0)
Hemoglobin: 13.8 g/dL (ref 12.0–15.0)
MCH: 30 pg (ref 26.0–34.0)
MCHC: 34.7 g/dL (ref 30.0–36.0)
MCV: 86.5 fL (ref 78.0–100.0)
PLATELETS: 266 10*3/uL (ref 150–400)
RBC: 4.6 MIL/uL (ref 3.87–5.11)
RDW: 13 % (ref 11.5–15.5)
WBC: 6.9 10*3/uL (ref 4.0–10.5)

## 2013-10-21 LAB — APTT: aPTT: 27 seconds (ref 24–37)

## 2013-10-21 LAB — PROTIME-INR
INR: 0.86 (ref 0.00–1.49)
Prothrombin Time: 11.6 seconds (ref 11.6–15.2)

## 2013-10-21 MED ORDER — CEFAZOLIN SODIUM-DEXTROSE 2-3 GM-% IV SOLR
2.0000 g | INTRAVENOUS | Status: AC
  Administered 2013-10-22: 2 g via INTRAVENOUS
  Filled 2013-10-21: qty 50

## 2013-10-21 NOTE — Pre-Procedure Instructions (Signed)
CARLIN MAMONE  10/21/2013   Your procedure is scheduled on:  Tuesday, October 22, 2013 at 7:30 AM  Report to [facility] [facility] (use Main Entrance "A'') at 5:30 AM.  Call this number if you have problems the morning of surgery: [phone]   Remember:   Do not eat food or drink liquids after midnight.   Take these medicines the morning of surgery with A SIP OF WATER: pantoprazole (PROTONIX) 40 MG tablet, if needed: oxyCODONE-acetaminophen (PERCOCET/ROXICET) 5-325 MG per tablet for pain Stop taking Aspirin, vitamins and herbal medications. Do not take any NSAIDs ie: Ibuprofen, Advil, Naproxen or any medication containing Aspirin. Do not wear jewelry, make-up or nail polish.  Do not wear lotions, powders, or perfumes. You may NOT wear deodorant.  Do not shave 48 hours prior to surgery.   Do not bring valuables to the hospital.  [facility] is not responsible  for any belongings or valuables.               Contacts, dentures or bridgework may not be worn into surgery.  Leave suitcase in the car. After surgery it may be brought to your room.  For patients admitted to the hospital, discharge time is determined by your treatment team.               Patients discharged the day of surgery will not be allowed to drive home.  Name and phone number of your driver:   Special Instructions:  Special Instructions:Special Instructions: [facility] - Preparing for Surgery  Before surgery, you can play an important role.  Because skin is not sterile, your skin needs to be as free of germs as possible.  You can reduce the number of germs on you skin by washing with CHG (chlorahexidine gluconate) soap before surgery.  CHG is an antiseptic cleaner which kills germs and bonds with the skin to continue killing germs even after washing.  Please DO NOT use if you have an allergy to CHG or antibacterial soaps.  If your skin becomes reddened/irritated stop using the CHG and inform your nurse when you arrive at  Short Stay.  Do not shave (including legs and underarms) for at least 48 hours prior to the first CHG shower.  You may shave your face.  Please follow these instructions carefully:   1.  Shower with CHG Soap the night before surgery and the morning of Surgery.  2.  If you choose to wash your hair, wash your hair first as usual with your normal shampoo.  3.  After you shampoo, rinse your hair and body thoroughly to remove the Shampoo.  4.  Use CHG as you would any other liquid soap.  You can apply chg directly  to the skin and wash gently with scrungie or a clean washcloth.  5.  Apply the CHG Soap to your body ONLY FROM THE NECK DOWN.  Do not use on open wounds or open sores.  Avoid contact with your eyes, ears, mouth and genitals (private parts).  Wash genitals (private parts) with your normal soap.  6.  Wash thoroughly, paying special attention to the area where your surgery will be performed.  7.  Thoroughly rinse your body with warm water from the neck down.  8.  DO NOT shower/wash with your normal soap after using and rinsing off the CHG Soap.  9.  Pat yourself dry with a clean towel.            10.  Wear clean pajamas.            11.  Place clean sheets on your bed the night of your first shower and do not sleep with pets.  Day of Surgery  Do not apply any lotions/deodorants the morning of surgery.  Please wear clean clothes to the hospital/surgery center.   Please read over the following fact sheets that you were given: Pain Booklet, Coughing and Deep Breathing and Surgical Site Infection Prevention

## 2013-10-21 NOTE — Progress Notes (Signed)
Anesthesia Chart Review:  Patient is a 63 year old female scheduled for left shoulder arthroscopy tomorrow by Dr. Sharol Given. History includes former smoker, L1-S1 ALIF '13, GERD, HLD, family history of post-operative N/V.  Murmur/MR is listed on her history but she denied. PCP is listed as Dr. Arlyss Queen.  EKG on 10/21/13 showed NSR, possible LAE, septal infarct (age undetermined). Currently, there are no comparison EKGs available.  No CV symptoms reported at her PAT visit this afternoon.    Preoperative labs noted.  Further evaluation by her assigned anesthesiologist tomorrow, but anticipate that she can proceed as planned.  [facility] Short Stay Center/Anesthesiology Phone [phone] 10/21/2013 5:12 PM

## 2013-10-21 NOTE — Progress Notes (Signed)
Pt denies SOB, chest pain, and being under the care of a cardiologist. Pt denies having a history of mitral regurgitation, a chest x ray and EKG within the last year. Pt denies having any cardiac studies except an echo more than 20 years ago.

## 2013-10-22 ENCOUNTER — Ambulatory Visit (HOSPITAL_COMMUNITY): Payer: 59 | Admitting: Anesthesiology

## 2013-10-22 ENCOUNTER — Encounter (HOSPITAL_COMMUNITY): Payer: 59 | Admitting: Vascular Surgery

## 2013-10-22 ENCOUNTER — Ambulatory Visit (HOSPITAL_COMMUNITY)
Admission: RE | Admit: 2013-10-22 | Discharge: 2013-10-22 | Disposition: A | Discharge status: Home / Self Care | Payer: 59 | Source: Ambulatory Visit | Attending: Orthopedic Surgery | Admitting: Orthopedic Surgery

## 2013-10-22 ENCOUNTER — Other Ambulatory Visit: Payer: Self-pay | Admitting: Emergency Medicine

## 2013-10-22 ENCOUNTER — Encounter (HOSPITAL_COMMUNITY)
Admission: RE | Disposition: A | Discharge status: Home / Self Care | Payer: Self-pay | Source: Ambulatory Visit | Attending: Orthopedic Surgery

## 2013-10-22 ENCOUNTER — Encounter (HOSPITAL_COMMUNITY): Payer: Self-pay | Admitting: *Deleted

## 2013-10-22 DIAGNOSIS — K219 Gastro-esophageal reflux disease without esophagitis: Secondary | ICD-10-CM | POA: Insufficient documentation

## 2013-10-22 DIAGNOSIS — S43439A Superior glenoid labrum lesion of unspecified shoulder, initial encounter: Secondary | ICD-10-CM | POA: Insufficient documentation

## 2013-10-22 DIAGNOSIS — G4730 Sleep apnea, unspecified: Secondary | ICD-10-CM | POA: Insufficient documentation

## 2013-10-22 DIAGNOSIS — I059 Rheumatic mitral valve disease, unspecified: Secondary | ICD-10-CM | POA: Insufficient documentation

## 2013-10-22 DIAGNOSIS — M25819 Other specified joint disorders, unspecified shoulder: Secondary | ICD-10-CM | POA: Insufficient documentation

## 2013-10-22 DIAGNOSIS — M75122 Complete rotator cuff tear or rupture of left shoulder, not specified as traumatic: Secondary | ICD-10-CM

## 2013-10-22 DIAGNOSIS — E785 Hyperlipidemia, unspecified: Secondary | ICD-10-CM | POA: Insufficient documentation

## 2013-10-22 DIAGNOSIS — M19019 Primary osteoarthritis, unspecified shoulder: Secondary | ICD-10-CM | POA: Insufficient documentation

## 2013-10-22 DIAGNOSIS — Z79899 Other long term (current) drug therapy: Secondary | ICD-10-CM | POA: Insufficient documentation

## 2013-10-22 DIAGNOSIS — X58XXXA Exposure to other specified factors, initial encounter: Secondary | ICD-10-CM | POA: Insufficient documentation

## 2013-10-22 DIAGNOSIS — R071 Chest pain on breathing: Secondary | ICD-10-CM | POA: Insufficient documentation

## 2013-10-22 DIAGNOSIS — K449 Diaphragmatic hernia without obstruction or gangrene: Secondary | ICD-10-CM | POA: Insufficient documentation

## 2013-10-22 DIAGNOSIS — M67919 Unspecified disorder of synovium and tendon, unspecified shoulder: Secondary | ICD-10-CM | POA: Insufficient documentation

## 2013-10-22 DIAGNOSIS — M7580 Other shoulder lesions, unspecified shoulder: Secondary | ICD-10-CM

## 2013-10-22 DIAGNOSIS — I252 Old myocardial infarction: Secondary | ICD-10-CM | POA: Insufficient documentation

## 2013-10-22 DIAGNOSIS — M719 Bursopathy, unspecified: Principal | ICD-10-CM | POA: Insufficient documentation

## 2013-10-22 HISTORY — PX: SHOULDER ARTHROSCOPY: SHX128

## 2013-10-22 SURGERY — ARTHROSCOPY, SHOULDER
Anesthesia: Regional | Site: Shoulder | Laterality: Left

## 2013-10-22 MED ORDER — DEXAMETHASONE SODIUM PHOSPHATE 4 MG/ML IJ SOLN
INTRAMUSCULAR | Status: DC | PRN
  Administered 2013-10-22: 4 mg via INTRAVENOUS

## 2013-10-22 MED ORDER — ACETAMINOPHEN 160 MG/5ML PO SOLN
325.0000 mg | ORAL | Status: DC | PRN
  Filled 2013-10-22: qty 20.3

## 2013-10-22 MED ORDER — DEXTROSE 5 % IV SOLN
INTRAVENOUS | Status: DC | PRN
  Administered 2013-10-22: 08:00:00 via INTRAVENOUS

## 2013-10-22 MED ORDER — MIDAZOLAM HCL 2 MG/2ML IJ SOLN
INTRAMUSCULAR | Status: AC
  Filled 2013-10-22: qty 2

## 2013-10-22 MED ORDER — PROPOFOL 10 MG/ML IV BOLUS
INTRAVENOUS | Status: AC
  Filled 2013-10-22: qty 20

## 2013-10-22 MED ORDER — OXYCODONE HCL 5 MG/5ML PO SOLN: 5.0000 mg | Freq: Once | ORAL | Status: DC | PRN

## 2013-10-22 MED ORDER — DEXAMETHASONE SODIUM PHOSPHATE 4 MG/ML IJ SOLN
INTRAMUSCULAR | Status: AC
  Filled 2013-10-22: qty 1

## 2013-10-22 MED ORDER — PHENYLEPHRINE 40 MCG/ML (10ML) SYRINGE FOR IV PUSH (FOR BLOOD PRESSURE SUPPORT)
PREFILLED_SYRINGE | INTRAVENOUS | Status: AC
  Filled 2013-10-22: qty 10

## 2013-10-22 MED ORDER — LIDOCAINE HCL (CARDIAC) 20 MG/ML IV SOLN
INTRAVENOUS | Status: DC | PRN
  Administered 2013-10-22: 70 mg via INTRAVENOUS

## 2013-10-22 MED ORDER — PROPOFOL INFUSION 10 MG/ML OPTIME
INTRAVENOUS | Status: DC | PRN
  Administered 2013-10-22: 140 ug/kg/min via INTRAVENOUS

## 2013-10-22 MED ORDER — LIDOCAINE HCL (CARDIAC) 20 MG/ML IV SOLN
INTRAVENOUS | Status: AC
  Filled 2013-10-22: qty 5

## 2013-10-22 MED ORDER — PROPOFOL 10 MG/ML IV BOLUS
INTRAVENOUS | Status: DC | PRN
  Administered 2013-10-22 (×2): 20 mg via INTRAVENOUS
  Administered 2013-10-22: 130 mg via INTRAVENOUS

## 2013-10-22 MED ORDER — PROMETHAZINE HCL 25 MG/ML IJ SOLN: 6.2500 mg | INTRAMUSCULAR | Status: DC | PRN

## 2013-10-22 MED ORDER — FENTANYL CITRATE 0.05 MG/ML IJ SOLN
INTRAMUSCULAR | Status: AC
  Filled 2013-10-22: qty 5

## 2013-10-22 MED ORDER — SODIUM CHLORIDE 0.9 % IR SOLN
Status: DC | PRN
  Administered 2013-10-22 (×2): 1000 mL
  Administered 2013-10-22: 3000 mL
  Administered 2013-10-22: 1000 mL

## 2013-10-22 MED ORDER — OXYCODONE HCL 5 MG PO TABS: 5.0000 mg | ORAL_TABLET | Freq: Once | ORAL | Status: DC | PRN

## 2013-10-22 MED ORDER — PHENYLEPHRINE HCL 10 MG/ML IJ SOLN
INTRAMUSCULAR | Status: DC | PRN
  Administered 2013-10-22: 40 ug via INTRAVENOUS
  Administered 2013-10-22: 80 ug via INTRAVENOUS
  Administered 2013-10-22 (×4): 40 ug via INTRAVENOUS
  Administered 2013-10-22: 80 ug via INTRAVENOUS

## 2013-10-22 MED ORDER — ARTIFICIAL TEARS OP OINT
TOPICAL_OINTMENT | OPHTHALMIC | Status: AC
  Filled 2013-10-22: qty 3.5

## 2013-10-22 MED ORDER — ARTIFICIAL TEARS OP OINT
TOPICAL_OINTMENT | OPHTHALMIC | Status: DC | PRN
  Administered 2013-10-22: 1 via OPHTHALMIC

## 2013-10-22 MED ORDER — ONDANSETRON HCL 4 MG/2ML IJ SOLN
INTRAMUSCULAR | Status: DC | PRN
  Administered 2013-10-22: 4 mg via INTRAVENOUS

## 2013-10-22 MED ORDER — KETOROLAC TROMETHAMINE 30 MG/ML IJ SOLN: 15.0000 mg | Freq: Once | INTRAMUSCULAR | Status: DC | PRN

## 2013-10-22 MED ORDER — SUCCINYLCHOLINE CHLORIDE 20 MG/ML IJ SOLN
INTRAMUSCULAR | Status: AC
  Filled 2013-10-22: qty 1

## 2013-10-22 MED ORDER — LACTATED RINGERS IV SOLN
INTRAVENOUS | Status: DC | PRN
  Administered 2013-10-22 (×2): via INTRAVENOUS

## 2013-10-22 MED ORDER — FENTANYL CITRATE 0.05 MG/ML IJ SOLN: 25.0000 ug | INTRAMUSCULAR | Status: DC | PRN

## 2013-10-22 MED ORDER — SUCCINYLCHOLINE CHLORIDE 20 MG/ML IJ SOLN
INTRAMUSCULAR | Status: DC | PRN
  Administered 2013-10-22: 40 mg via INTRAVENOUS

## 2013-10-22 MED ORDER — ACETAMINOPHEN 325 MG PO TABS: 325.0000 mg | ORAL_TABLET | ORAL | Status: DC | PRN

## 2013-10-22 MED ORDER — HYDROCODONE-ACETAMINOPHEN 5-325 MG PO TABS: 1.0000 | ORAL_TABLET | Freq: Four times a day (QID) | ORAL | Status: DC | PRN

## 2013-10-22 MED ORDER — MIDAZOLAM HCL 5 MG/5ML IJ SOLN
INTRAMUSCULAR | Status: DC | PRN
  Administered 2013-10-22 (×2): 1 mg via INTRAVENOUS

## 2013-10-22 MED ORDER — FENTANYL CITRATE 0.05 MG/ML IJ SOLN
INTRAMUSCULAR | Status: DC | PRN
  Administered 2013-10-22 (×2): 50 ug via INTRAVENOUS

## 2013-10-22 SURGICAL SUPPLY — 37 items
BLADE GREAT WHITE 4.2 (BLADE) ×2 IMPLANT
BLADE GREAT WHITE 4.2MM (BLADE) ×1
BLADE SURG 11 STRL SS (BLADE) ×2 IMPLANT
BUR OVAL 6.0 (BURR) ×3 IMPLANT
CANNULA SHOULDER 7CM (CANNULA) ×3 IMPLANT
CLOTH BEACON ORANGE TIMEOUT ST (SAFETY) ×1 IMPLANT
COVER SURGICAL LIGHT HANDLE (MISCELLANEOUS) ×1 IMPLANT
DRAPE INCISE IOBAN 66X45 STRL (DRAPES) ×3 IMPLANT
DRAPE STERI 35X30 U-POUCH (DRAPES) ×3 IMPLANT
DRAPE U-SHAPE 47X51 STRL (DRAPES) ×3 IMPLANT
DRSG EMULSION OIL 3X3 NADH (GAUZE/BANDAGES/DRESSINGS) ×3 IMPLANT
DRSG PAD ABDOMINAL 8X10 ST (GAUZE/BANDAGES/DRESSINGS) ×4 IMPLANT
DURAPREP 26ML APPLICATOR (WOUND CARE) ×3 IMPLANT
GLOVE BIOGEL PI IND STRL 9 (GLOVE) ×1 IMPLANT
GLOVE BIOGEL PI INDICATOR 9 (GLOVE) ×2
GLOVE SURG ORTHO 9.0 STRL STRW (GLOVE) ×3 IMPLANT
GOWN PREVENTION PLUS XLARGE (GOWN DISPOSABLE) ×3 IMPLANT
GOWN SRG XL XLNG 56XLVL 4 (GOWN DISPOSABLE) ×1 IMPLANT
GOWN STRL NON-REIN XL XLG LVL4 (GOWN DISPOSABLE) ×3
KIT BASIN OR (CUSTOM PROCEDURE TRAY) ×3 IMPLANT
KIT ROOM TURNOVER OR (KITS) ×3 IMPLANT
MANIFOLD NEPTUNE II (INSTRUMENTS) ×3 IMPLANT
NDL SPNL 18GX3.5 QUINCKE PK (NEEDLE) ×1 IMPLANT
NEEDLE SPNL 18GX3.5 QUINCKE PK (NEEDLE) ×3 IMPLANT
NS IRRIG 1000ML POUR BTL (IV SOLUTION) ×3 IMPLANT
PACK SHOULDER (CUSTOM PROCEDURE TRAY) ×3 IMPLANT
PAD ARMBOARD 7.5X6 YLW CONV (MISCELLANEOUS) ×6 IMPLANT
SET ARTHROSCOPY TUBING (MISCELLANEOUS) ×3
SET ARTHROSCOPY TUBING LN (MISCELLANEOUS) ×1 IMPLANT
SLING ARM IMMOBILIZER XL (CAST SUPPLIES) ×3 IMPLANT
SPONGE GAUZE 4X4 12PLY (GAUZE/BANDAGES/DRESSINGS) ×3 IMPLANT
SPONGE GAUZE 4X4 12PLY STER LF (GAUZE/BANDAGES/DRESSINGS) ×2 IMPLANT
SPONGE LAP 4X18 X RAY DECT (DISPOSABLE) ×3 IMPLANT
SUT ETHILON 2 0 FS 18 (SUTURE) ×3 IMPLANT
TOWEL OR 17X24 6PK STRL BLUE (TOWEL DISPOSABLE) ×6 IMPLANT
WAND 90 DEG TURBOVAC W/CORD (SURGICAL WAND) ×2 IMPLANT
WATER STERILE IRR 1000ML POUR (IV SOLUTION) ×3 IMPLANT

## 2013-10-22 NOTE — Anesthesia Preprocedure Evaluation (Addendum)
Anesthesia Evaluation  Patient identified by MRN, date of birth, ID band Patient awake    Reviewed: Allergy & Precautions, H&P , NPO status , Patient's Chart, lab work & pertinent test results, reviewed documented beta blocker date and time   History of Anesthesia Complications (+) PONV and history of anesthetic complications  Airway Mallampati: I TM Distance: >3 FB Neck ROM: Full    Dental  (+) Partial Upper, Teeth Intact and Dental Advisory Given   Pulmonary neg sleep apnea, neg COPDneg recent URI, former smoker,    Pulmonary exam normal       Cardiovascular - angina- Past MI and - CHF negative cardio ROS  Rhythm:Regular Rate:Normal     Neuro/Psych Back surgery with rods, numbness inner thigh right   Neuromuscular disease negative psych ROS   GI/Hepatic Neg liver ROS, hiatal hernia, GERD-  Medicated and Controlled,Protonix bid but none today   Endo/Other  negative endocrine ROS  Renal/GU negative Renal ROS     Musculoskeletal  (+) Arthritis -, Osteoarthritis,    Abdominal   Peds  Hematology negative hematology ROS (+)   Anesthesia Other Findings Baseline discrepancy in pupil size noted; right pupil is 2.5-3 mm at all times Upper two front teeth are crowned  Reproductive/Obstetrics                         Anesthesia Physical Anesthesia Plan  ASA: II  Anesthesia Plan: General and Regional   Post-op Pain Management:    Induction: Intravenous  Airway Management Planned: Oral ETT  Additional Equipment: None  Intra-op Plan:   Post-operative Plan: Extubation in OR  Informed Consent: I have reviewed the patients History and Physical, chart, labs and discussed the procedure including the risks, benefits and alternatives for the proposed anesthesia with the patient or authorized representative who has indicated his/her understanding and acceptance.   Dental advisory given  Plan  Discussed with: CRNA and Surgeon  Anesthesia Plan Comments:         Anesthesia Quick Evaluation

## 2013-10-22 NOTE — Preoperative (Signed)
Beta Blockers   Reason not to administer Beta Blockers:Not Applicable 

## 2013-10-22 NOTE — Discharge Instructions (Signed)
Ice elevation left shoulder. Discontinue sling tomorrow. Start range of motion tomorrow.  What to eat:  For your first meals, you should eat lightly; only small meals initially.  If you do not have nausea, you may eat larger meals.  Avoid spicy, greasy and heavy food.    General Anesthesia, Adult, Care After  Refer to this sheet in the next few weeks. These instructions provide you with information on caring for yourself after your procedure. Your health care provider may also give you more specific instructions. Your treatment has been planned according to current medical practices, but problems sometimes occur. Call your health care provider if you have any problems or questions after your procedure.  WHAT TO EXPECT AFTER THE PROCEDURE  After the procedure, it is typical to experience:  Sleepiness.  Nausea and vomiting. HOME CARE INSTRUCTIONS  For the first 24 hours after general anesthesia:  Have a responsible person with you.  Do not drive a car. If you are alone, do not take public transportation.  Do not drink alcohol.  Do not take medicine that has not been prescribed by your health care provider.  Do not sign important papers or make important decisions.  You may resume a normal diet and activities as directed by your health care provider.  Change bandages (dressings) as directed.  If you have questions or problems that seem related to general anesthesia, call the hospital and ask for the anesthetist or anesthesiologist on call. SEEK MEDICAL CARE IF:  You have nausea and vomiting that continue the day after anesthesia.  You develop a rash. SEEK IMMEDIATE MEDICAL CARE IF:  You have difficulty breathing.  You have chest pain.  You have any allergic problems. Document Released: 12/05/2000 Document Revised: 05/01/2013 Document Reviewed: 03/14/2013  [facility] Patient Information 2014 [place], [place].

## 2013-10-22 NOTE — Progress Notes (Signed)
Report given to philip rn as caregiver 

## 2013-10-22 NOTE — Anesthesia Postprocedure Evaluation (Signed)
  Anesthesia Post-op Note  Patient: Rachel Boone  Procedure(s) Performed: Procedure(s) with comments: ARTHROSCOPY SHOULDER (Left) - Left Shoulder Arthroscopy, Debridement, and Decompression, subacromial decompression and distal clavicle resection  Patient Location: PACU  Anesthesia Type:General and Regional  Level of Consciousness: awake, alert  and oriented  Airway and Oxygen Therapy: Patient Spontanous Breathing and Patient connected to nasal cannula oxygen  Post-op Pain: none  Post-op Assessment: Post-op Vital signs reviewed, Patient's Cardiovascular Status Stable, Respiratory Function Stable, Patent Airway, No signs of Nausea or vomiting and Pain level controlled  Post-op Vital Signs: Reviewed and stable  Complications: No apparent anesthesia complications

## 2013-10-22 NOTE — Transfer of Care (Signed)
Immediate Anesthesia Transfer of Care Note  Patient: Rachel Boone  Procedure(s) Performed: Procedure(s) with comments: ARTHROSCOPY SHOULDER (Left) - Left Shoulder Arthroscopy, Debridement, and Decompression, subacromial decompression and distal clavicle resection  Patient Location: PACU  Anesthesia Type:General  Level of Consciousness: awake, sedated and patient cooperative  Airway & Oxygen Therapy: Patient Spontanous Breathing and Patient connected to nasal cannula oxygen  Post-op Assessment: Report given to PACU RN and Post -op Vital signs reviewed and stable  Post vital signs: Reviewed and stable  Complications: No apparent anesthesia complications

## 2013-10-22 NOTE — Op Note (Signed)
OPERATIVE REPORT  DATE OF SURGERY: 10/22/2013  PATIENT:  Rachel Boone,  63 y.o. female  PRE-OPERATIVE DIAGNOSIS:  Rotator cuff tear with impingement Left shoulder with a.c. arthropathy  POST-OPERATIVE DIAGNOSIS:  Rotator cuff tear with impingement Left shoulder with a.c. arthropathy  PROCEDURE:  Procedure(s): ARTHROSCOPY SHOULDER Debridement rotator cuff tear debridement of SLAP lesion. Subacromial decompression. Distal clavicle resection.  SURGEON:  Surgeon(s): Newt Minion, MD  ANESTHESIA:   regional and general  EBL:  min ML  SPECIMEN:  No Specimen  TOURNIQUET:  * No tourniquets in log *  PROCEDURE DETAILS: Patient is a 63 year old woman with rotator cuff tear of the left shoulder he'll is pain with activities of daily living she has a prominent a.c. joint with impingement. Patient has failed conservative care and presents at this time for surgical intervention. Risks and benefits were discussed including infection neurovascular injury persistent pain need for additional surgery. Patient states she understands and wished to proceed at this time. Description of procedure patient was brought to the operating room after an interscalene block she then underwent a general anesthetic. After adequate levels of anesthesia were obtained patient was placed in a beachchair position and the left upper extremity was prepped using DuraPrep and draped into a sterile field. The scope was inserted from the posterior portal and anterior portal was established with outside in technique with an 18-gauge spinal. Visualization showed a grade 1 SLAP lesion both anteriorly and posteriorly she had a full thickness rotator cuff tear approximately 2 cm in diameter. Patient underwent debridement of rotator cuff tear debridement SLAP lesion the biceps tendon was intact the articular cartilage was in good condition. Instruments were removed the scope was inserted from the posterior portal into the subacromial  space and a new lateral portal was established. Visualization patient had a very prominent distal clavicle with a.c. arthropathy and hooked type III acromion. Patient underwent bursectomy subacromial decompression and distal clavicle resection. Approximately 1 cm the distal clavicle was resected. The vapor wand was used for hemostasis. The isthmus removed the portals were closed using 2-0 nylon. The wound was covered with Adaptic orthopedic sponges ABDs dressing and Hypafix tape. Patient was placed in a sling extubated taken to the PACU in stable condition.  PLAN OF CARE: Discharge to home after PACU  PATIENT DISPOSITION:  PACU - hemodynamically stable.   Newt Minion, MD 10/22/2013 8:50 AM

## 2013-10-22 NOTE — Anesthesia Procedure Notes (Addendum)
Procedure Name: Intubation Date/Time: 10/22/2013 7:49 AM Performed by: Terrill Mohr Pre-anesthesia Checklist: Patient identified, Emergency Drugs available, Suction available and Patient being monitored Patient Re-evaluated:Patient Re-evaluated prior to inductionOxygen Delivery Method: Circle system utilized Preoxygenation: Pre-oxygenation with 100% oxygen Intubation Type: IV induction Ventilation: Mask ventilation without difficulty Laryngoscope Size: Mac and 3 Grade View: Grade I Tube type: Oral Tube size: 7.5 mm Number of attempts: 1 Airway Equipment and Method: Stylet Placement Confirmation: ETT inserted through vocal cords under direct vision,  breath sounds checked- equal and bilateral and positive ETCO2 Secured at: 21 (cm at teeth) cm Tube secured with: Tape Dental Injury: Teeth and Oropharynx as per pre-operative assessment    Anesthesia Regional Block:  Interscalene brachial plexus block  Pre-Anesthetic Checklist: ,, timeout performed, Correct Patient, Correct Site, Correct Laterality, Correct Procedure, Correct Position, site marked, Risks and benefits discussed,  Surgical consent,  Pre-op evaluation,  At surgeon's request and post-op pain management  Laterality: Left and Upper  Prep: chloraprep       Needles:  Injection technique: Single-shot  Needle Type: Echogenic Stimulator Needle     Needle Length: 5cm 5 cm Needle Gauge: 22 and 22 G    Additional Needles:  Procedures: ultrasound guided (picture in chart) and nerve stimulator Interscalene brachial plexus block  Nerve Stimulator or Paresthesia:  Response: bicep contraction, 0.45 mA,   Additional Responses:   Narrative:  Start time: 10/22/2013 7:12 AM End time: 10/22/2013 7:19 AM Injection made incrementally with aspirations every 5 mL.  Performed by: Personally  Anesthesiologist: J. Tamela Gammon, MD  Additional Notes: Functioning IV was confirmed and monitors applied.  A 63mm 22ga echogenic arrow  stimulator was used. Sterile prep and drape,hand hygiene and sterile gloves were used.Ultrasound guidance: relevant anatomy identified, needle position confirmed, local anesthetic spread visualized around nerve(s)., vascular puncture avoided.  Image printed for medical record.  Negative aspiration and negative test dose prior to incremental administration of local anesthetic. The patient tolerated the procedure well.

## 2013-10-22 NOTE — H&P (Signed)
Rachel Boone is an 63 y.o. female.   Chief Complaint: Rotator cuff tear with impingement left shoulder HPI: Patient is a 63 year old woman with pain with activities of daily living of her left shoulder she has failed conservative treatment and presents at this time for arthroscopic intervention.  Past Medical History  Diagnosis Date  . GERD (gastroesophageal reflux disease)   . Hyperlipidemia   . Endocervical polyp   . Mitral regurgitation     patient denied 10/21/13  . Hammertoe   . Pupil irregular   . Arthritis   . Heart murmur   . Allergy     SEASONAL  . Cataract   . PONV (postoperative nausea and vomiting)   . Hiatal hernia   . Family history of anesthesia complication     Family history of PONV    Past Surgical History  Procedure Laterality Date  . Hammer toe surgery    . Pupilloplasty    . Thumb fusion      LEFT  . Colonoscopy    . Eye surgery    . Cataract extraction      bilateral  . Anterior lateral lumbar fusion 4 levels  07/19/2012    Procedure: ANTERIOR LATERAL LUMBAR FUSION 4 LEVELS;  Surgeon: Rachel Spangle, MD;  Location: [place] [facility];  Service: Neurosurgery;  Laterality: N/A;  Lumbar one-two, lumbar two-three, lumbar three-four, lumbar four-five, lumbar five-sacral one anterolateral lumbar fusion with Lumbar one-sacral one posterior instrumentation  . Lumbar percutaneous pedicle screw 3 level  07/19/2012    Procedure: LUMBAR PERCUTANEOUS PEDICLE SCREW 3 LEVEL;  Surgeon: Rachel Spangle, MD;  Location: [place] [facility];  Service: Neurosurgery;  Laterality: Bilateral;  Lumbar one-two, lumbar two-three, lumbar three-four, lumbar four-five, lumbar five-sacral one anterolateral lumbar fusion with Lumbar one-sacral one posterior instrumentation    Family History  Problem Relation Age of Onset  . Colon polyps Sister     adenomatous  . Lung cancer Mother   . Diabetes type II Mother   . Hypertension Brother   . Hypertension Sister   . Diabetes type II Sister   .  Heart disease Father   . Colon cancer Neg Hx    Social History:  reports that she quit smoking about 24 years ago. She has never used smokeless tobacco. She reports that she drinks alcohol. She reports that she does not use illicit drugs.  Allergies:  Allergies  Allergen Reactions  . Other     Anesthesia=Nausea/Vomiting    Medications Prior to Admission  Medication Sig Dispense Refill  . diphenhydrAMINE (BENADRYL) 50 MG capsule Take 25 mg by mouth every 6 (six) hours as needed.      . naproxen (NAPROSYN) 500 MG tablet Take 1 tablet (500 mg total) by mouth 2 (two) times daily with a meal.  180 tablet  1  . pantoprazole (PROTONIX) 40 MG tablet Take 40 mg by mouth daily.      . cyclobenzaprine (FLEXERIL) 10 MG tablet Take 1 tablet (10 mg total) by mouth 3 (three) times daily as needed for muscle spasms.  60 tablet  1  . oxyCODONE-acetaminophen (PERCOCET/ROXICET) 5-325 MG per tablet Take 1-2 tablets by mouth every 4 (four) hours as needed for pain.  80 tablet  0    Results for orders placed during the hospital encounter of 10/21/13 (from the past 48 hour(s))  APTT     Status: None   Collection Time    10/21/13  3:31 PM  Result Value Range   aPTT 27  24 - 37 seconds  CBC     Status: None   Collection Time    10/21/13  3:31 PM      Result Value Range   WBC 6.9  4.0 - 10.5 K/uL   RBC 4.60  3.87 - 5.11 MIL/uL   Hemoglobin 13.8  12.0 - 15.0 g/dL   HCT 39.8  36.0 - 46.0 %   MCV 86.5  78.0 - 100.0 fL   MCH 30.0  26.0 - 34.0 pg   MCHC 34.7  30.0 - 36.0 g/dL   RDW 13.0  11.5 - 15.5 %   Platelets 266  150 - 400 K/uL  COMPREHENSIVE METABOLIC PANEL     Status: Abnormal   Collection Time    10/21/13  3:31 PM      Result Value Range   Sodium 142  137 - 147 mEq/L   Potassium 4.4  3.7 - 5.3 mEq/L   Chloride 102  96 - 112 mEq/L   CO2 28  19 - 32 mEq/L   Glucose, Bld 74  70 - 99 mg/dL   BUN 15  6 - 23 mg/dL   Creatinine, Ser 0.87  0.50 - 1.10 mg/dL   Calcium 9.4  8.4 - 10.5 mg/dL    Total Protein 7.5  6.0 - 8.3 g/dL   Albumin 3.9  3.5 - 5.2 g/dL   AST 22  0 - 37 U/L   ALT 12  0 - 35 U/L   Alkaline Phosphatase 61  39 - 117 U/L   Total Bilirubin <0.2 (*) 0.3 - 1.2 mg/dL   GFR calc non Af Amer 69 (*) >90 mL/min   GFR calc Af Amer 80 (*) >90 mL/min   Comment: (NOTE)     The eGFR has been calculated using the CKD EPI equation.     This calculation has not been validated in all clinical situations.     eGFR's persistently <90 mL/min signify possible Chronic Kidney     Disease.  PROTIME-INR     Status: None   Collection Time    10/21/13  3:31 PM      Result Value Range   Prothrombin Time 11.6  11.6 - 15.2 seconds   INR 0.86  0.00 - 1.49   Dg Chest 2 View  10/21/2013   CLINICAL DATA:  Preop for left shoulder surgery.  EXAM: CHEST  2 VIEW  COMPARISON:  None available.  FINDINGS: The heart size and mediastinal contours are within normal limits. Both lungs are clear. The visualized skeletal structures are unremarkable.  IMPRESSION: No active cardiopulmonary disease.   Electronically Signed   By: Rachel Boone M.D.   On: 10/21/2013 16:08    Review of Systems  All other systems reviewed and are negative.    Blood pressure 130/83, temperature 97.4 F (36.3 C), resp. rate 18, SpO2 99.00%. Physical Exam  On examination patient has pain with Neer and Hawkins impingement test pain with drop arm test left shoulder. Radiograph shows decreased subacromial joint space with type III acromion. Assessment/Plan Assessment: Rotator cuff tear with impingement left shoulder.  Plan: Will plan for left shoulder arthroscopy with subacromial decompression debridement rotator cuff tear debridement SLAP lesion. Risks and benefits were discussed including infection neurovascular injury persistent pain need for additional surgery. Patient states she understands and wished to proceed at this time.  Rachel Boone V 10/22/2013, 6:26 AM

## 2013-10-24 ENCOUNTER — Encounter (HOSPITAL_COMMUNITY): Payer: Self-pay | Admitting: Orthopedic Surgery

## 2013-11-05 MED ORDER — ROPIVACAINE HCL 5 MG/ML IJ SOLN
INTRAMUSCULAR | Status: DC | PRN
  Administered 2013-10-22: 20 mL via PERINEURAL

## 2013-11-05 NOTE — Addendum Note (Signed)
Addendum created 11/05/13 1317 by Laurie Panda, MD   Modules edited: Anesthesia Blocks and Procedures, Anesthesia Medication Administration, Clinical Notes   Clinical Notes:  File: [id number]

## 2013-11-28 IMAGING — RF DG C-ARM GT 120 MIN
1 series · 6 of 6 positions shown · non-contrast
Comparison: None.

CLINICAL DATA: Lumbar disc disease.

LUMBAR SPINE - COMPLETE 4+ VIEW,DG C-ARM GT 120 MIN

[Series 1: run · 6 of 6 slices shown]
[im 1/6]
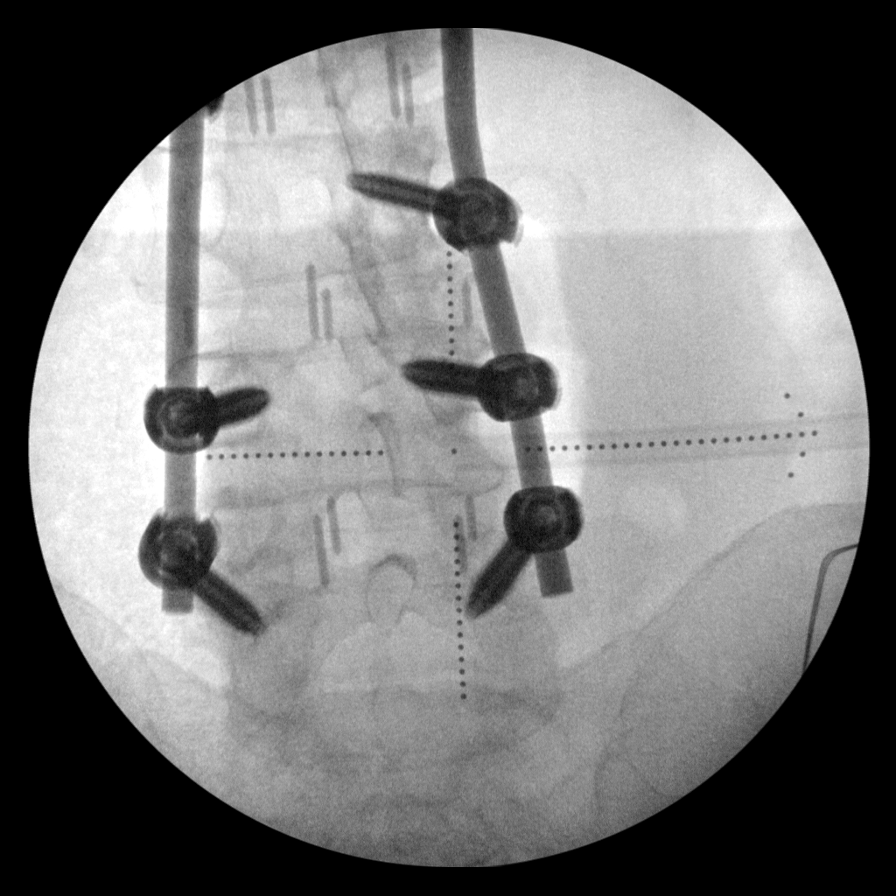
[im 2/6]
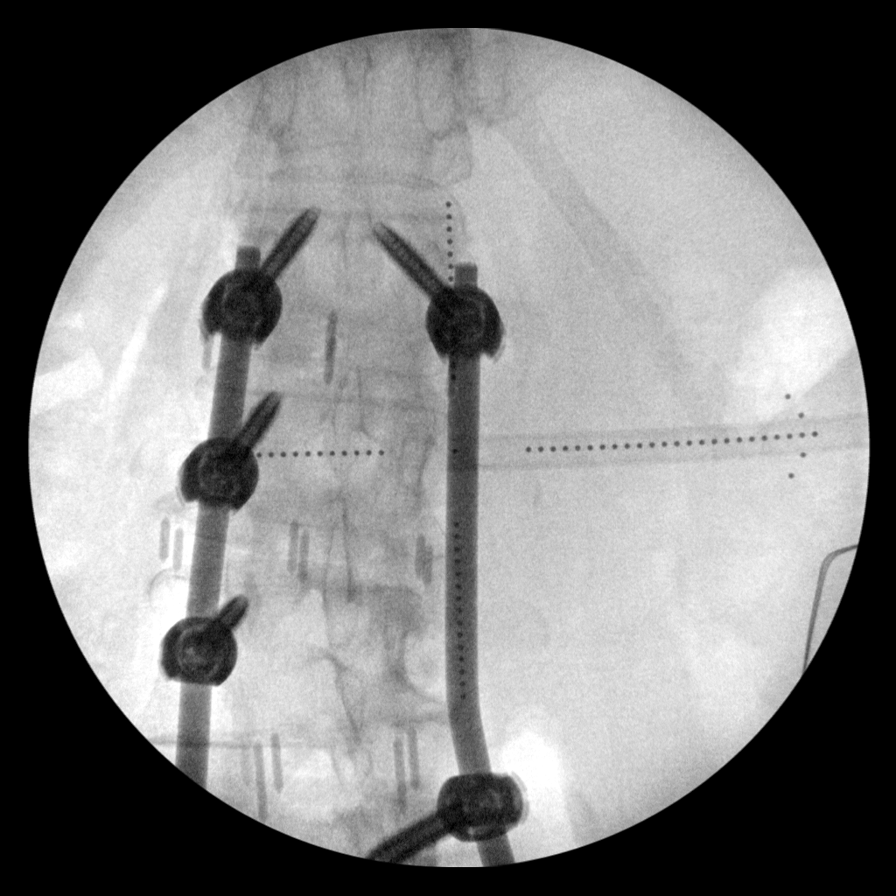
[im 3/6]
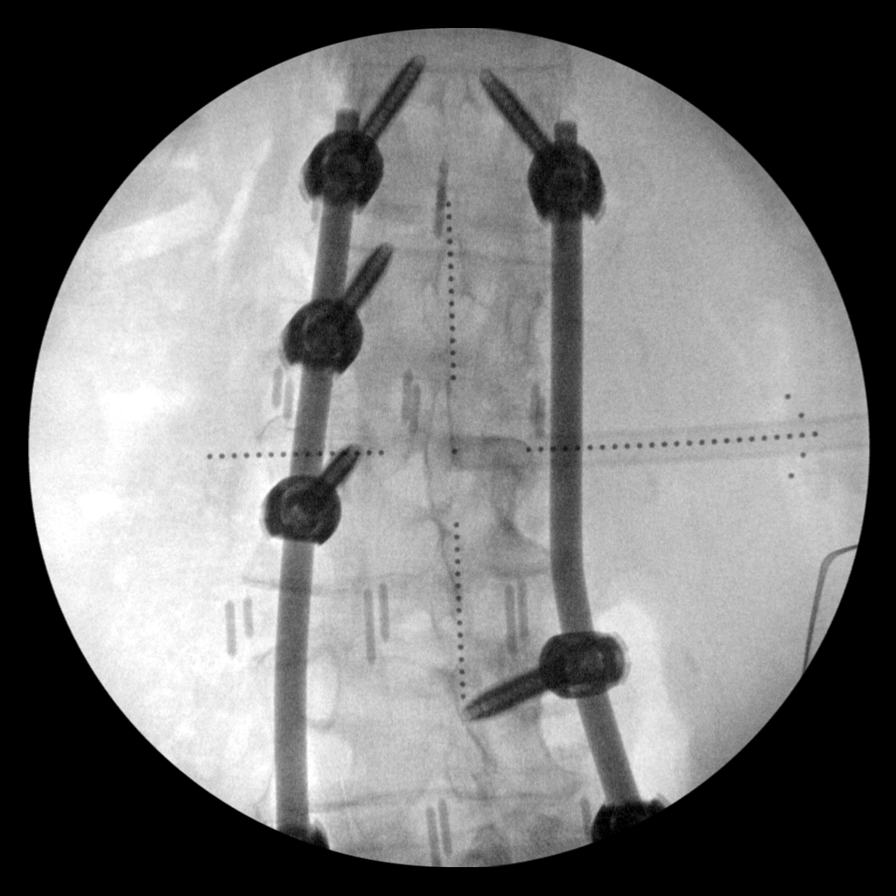
[im 4/6]
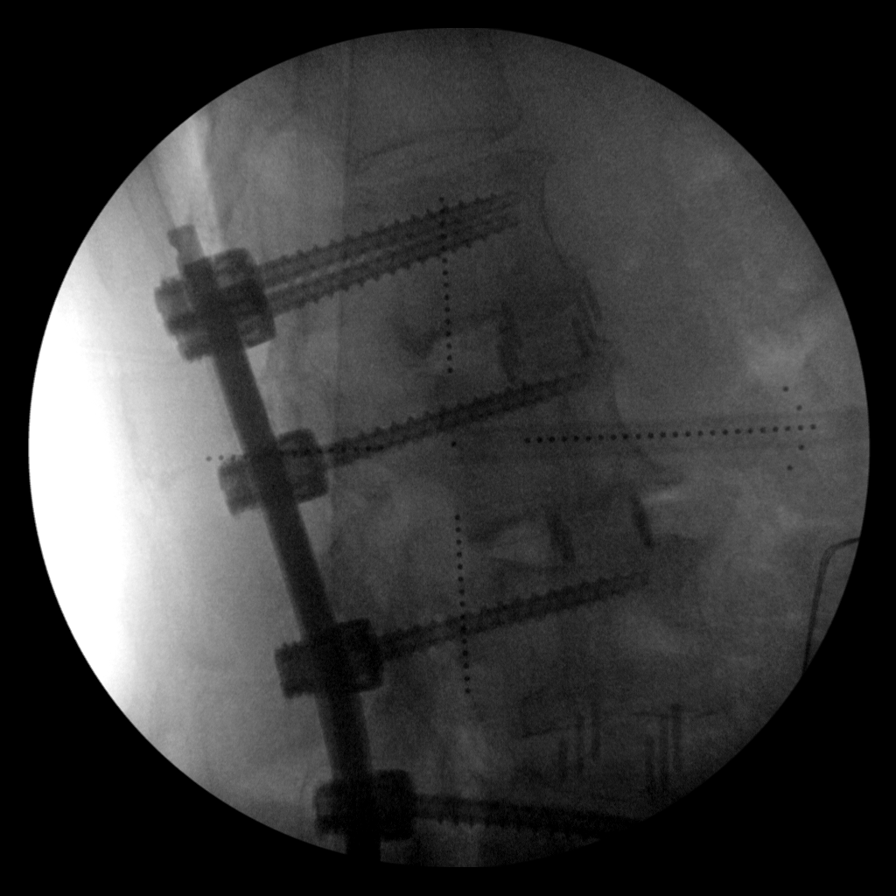
[im 5/6]
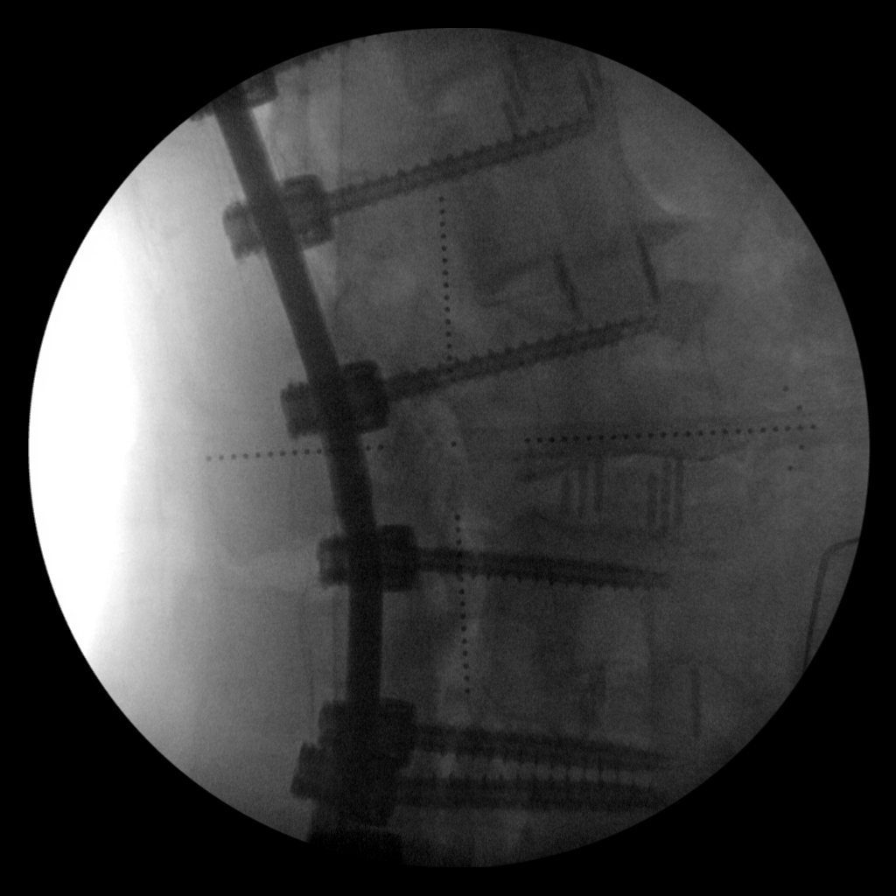
[im 6/6]
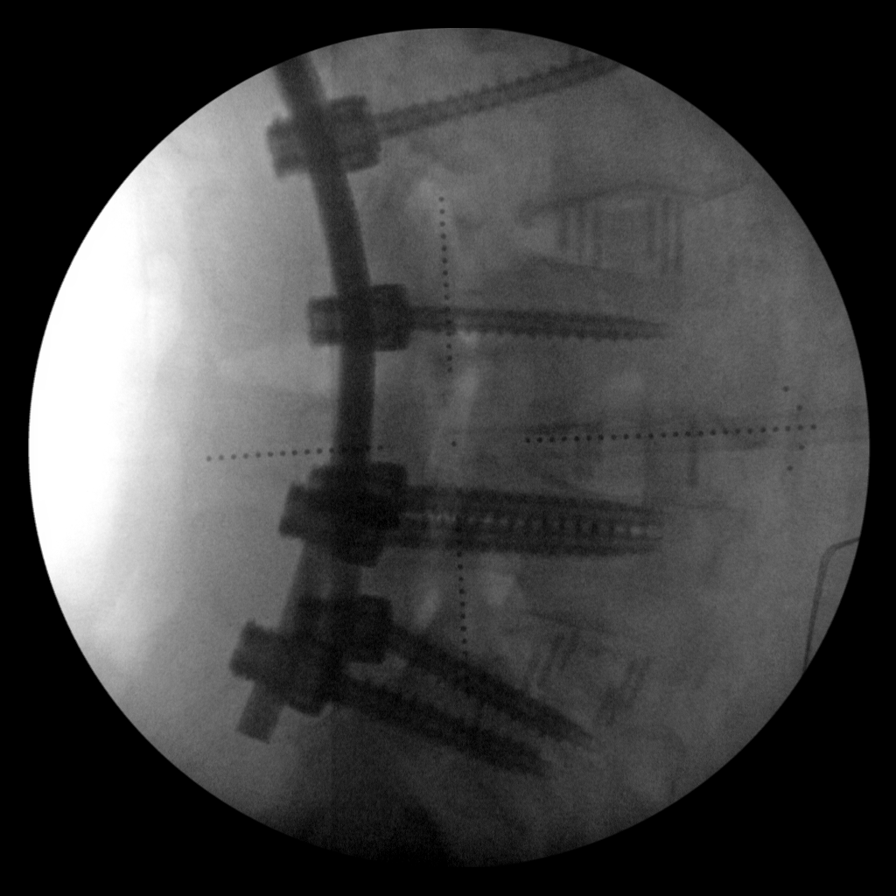

[6 of 6 positions shown; findings below may reference images not displayed]

FINDINGS: Multiple c-arm images demonstrate the patient has
undergone interbody and posterior fusion from L1-S1.  Pedicle
screws, posterior rods and interbody fusion devices are present at
the operative levels.
IMPRESSION: Fusion performed from L1-S1.  S1 appears to be lumbarized.

## 2014-01-23 ENCOUNTER — Ambulatory Visit
Admission: RE | Admit: 2014-01-23 | Discharge: 2014-01-23 | Disposition: A | Discharge status: Home / Self Care | Payer: 59 | Source: Ambulatory Visit | Attending: Neurosurgery | Admitting: Neurosurgery

## 2014-01-23 ENCOUNTER — Other Ambulatory Visit: Payer: Self-pay | Admitting: Neurosurgery

## 2014-01-23 DIAGNOSIS — M47817 Spondylosis without myelopathy or radiculopathy, lumbosacral region: Secondary | ICD-10-CM

## 2014-04-03 ENCOUNTER — Encounter: Payer: Self-pay | Admitting: Emergency Medicine

## 2014-05-23 ENCOUNTER — Ambulatory Visit (INDEPENDENT_AMBULATORY_CARE_PROVIDER_SITE_OTHER): Payer: 59 | Admitting: Emergency Medicine

## 2014-05-23 VITALS — BP 108/64 | HR 67 | Temp 98.2°F | Resp 17 | Ht 66.0 in | Wt 144.0 lb

## 2014-05-23 DIAGNOSIS — M129 Arthropathy, unspecified: Secondary | ICD-10-CM

## 2014-05-23 DIAGNOSIS — K2100 Gastro-esophageal reflux disease with esophagitis, without bleeding: Secondary | ICD-10-CM

## 2014-05-23 DIAGNOSIS — Z79899 Other long term (current) drug therapy: Secondary | ICD-10-CM

## 2014-05-23 DIAGNOSIS — M1990 Unspecified osteoarthritis, unspecified site: Secondary | ICD-10-CM | POA: Insufficient documentation

## 2014-05-23 DIAGNOSIS — Z23 Encounter for immunization: Secondary | ICD-10-CM

## 2014-05-23 DIAGNOSIS — K219 Gastro-esophageal reflux disease without esophagitis: Secondary | ICD-10-CM | POA: Insufficient documentation

## 2014-05-23 LAB — LIPID PANEL
CHOLESTEROL: 284 mg/dL — AB (ref 0–200)
HDL: 66 mg/dL (ref >39)
LDL CALC: 204 mg/dL — AB (ref 0–99)
TRIGLYCERIDES: 69 mg/dL (ref <150)
Total CHOL/HDL Ratio: 4.3 Ratio
VLDL: 14 mg/dL (ref 0–40)

## 2014-05-23 LAB — COMPREHENSIVE METABOLIC PANEL
ALK PHOS: 58 U/L (ref 39–117)
ALT: 8 U/L (ref 0–35)
AST: 19 U/L (ref 0–37)
Albumin: 4.5 g/dL (ref 3.5–5.2)
BUN: 17 mg/dL (ref 6–23)
CO2: 28 meq/L (ref 19–32)
Calcium: 9.6 mg/dL (ref 8.4–10.5)
Chloride: 102 mEq/L (ref 96–112)
Creat: 0.82 mg/dL (ref 0.50–1.10)
GLUCOSE: 83 mg/dL (ref 70–99)
Potassium: 4.7 mEq/L (ref 3.5–5.3)
SODIUM: 139 meq/L (ref 135–145)
TOTAL PROTEIN: 7.2 g/dL (ref 6.0–8.3)
Total Bilirubin: 0.5 mg/dL (ref 0.2–1.2)

## 2014-05-23 LAB — POCT CBC
Granulocyte percent: 60 %G (ref 37–80)
HEMATOCRIT: 46.3 % (ref 37.7–47.9)
HEMOGLOBIN: 14.9 g/dL (ref 12.2–16.2)
Lymph, poc: 1.6 (ref 0.6–3.4)
MCH, POC: 28.6 pg (ref 27–31.2)
MCHC: 32.3 g/dL (ref 31.8–35.4)
MCV: 88.5 fL (ref 80–97)
MID (CBC): 0.4 (ref 0–0.9)
MPV: 6.8 fL (ref 0–99.8)
POC Granulocyte: 2.9 (ref 2–6.9)
POC LYMPH PERCENT: 32.3 %L (ref 10–50)
POC MID %: 7.7 %M (ref 0–12)
Platelet Count, POC: 254 10*3/uL (ref 142–424)
RBC: 5.23 M/uL (ref 4.04–5.48)
RDW, POC: 13.6 %
WBC: 4.8 10*3/uL (ref 4.6–10.2)

## 2014-05-23 LAB — VITAMIN B12: Vitamin B-12: 408 pg/mL (ref 211–911)

## 2014-05-23 LAB — MAGNESIUM: Magnesium: 2.1 mg/dL (ref 1.5–2.5)

## 2014-05-23 LAB — FERRITIN: FERRITIN: 46 ng/mL (ref 10–291)

## 2014-05-23 MED ORDER — PANTOPRAZOLE SODIUM 40 MG PO TBEC: 40.0000 mg | DELAYED_RELEASE_TABLET | Freq: Two times a day (BID) | ORAL | Status: DC

## 2014-05-23 MED ORDER — NAPROXEN 500 MG PO TABS: 500.0000 mg | ORAL_TABLET | Freq: Two times a day (BID) | ORAL | Status: DC

## 2014-05-23 NOTE — Progress Notes (Signed)
Subjective:  This chart was scribed for Arlyss Queen, MD by Terressa Koyanagi, ED Scribe. This patient was seen in room 9 and the patient's care was started at 10:17 AM.     Patient ID: Rachel Boone, female    DOB: Dec 19, 1950, 63 y.o.   MRN: [id number]  HPI HPI Comments: Rachel Boone is a 63 y.o. female, with medical Hx noted below, who presents to the [facility] for refill of her: Naprosyn and Protonix.    Overall [place]: Pt reports she has been feeling well and her recent back surgery went very well. 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 Pt, however, notes that she is experiencing heart burn at night and taking Tums and Zantac for her Sx with little relief.   Preventive Service: Pt reports she had a colonoscopy three years ago. Pt also reports she had her mammogram completed last month.   Pt cannot recall her last tetanus shot. Pt declines a flu shot or pneumonia shot.   Denials: Pt denies SOB, chest pain, fever, chills.   Past Medical History  Diagnosis Date  . GERD (gastroesophageal reflux disease)   . Hyperlipidemia   . Endocervical polyp   . Mitral regurgitation     patient denied 10/21/13  . Hammertoe   . Pupil irregular   . Arthritis   . Heart murmur   . Allergy     SEASONAL  . Cataract   . PONV (postoperative nausea and vomiting)   . Hiatal hernia   . Family history of anesthesia complication     Family history of PONV   Review of Systems  Constitutional: Negative for fever and chills.  Respiratory: Negative for shortness of breath.   Cardiovascular: Negative for chest pain.  Psychiatric/Behavioral: Negative for confusion.      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8  4.6 - 10.2 K/uL   Lymph, poc 1.6  0.6 - 3.4   POC LYMPH PERCENT 32.3  10 - 50 %L   MID (cbc) 0.4  0 - 0.9   POC MID % 7.7  0 - 12 %M   POC Granulocyte 2.9  2 - 6.9   Granulocyte percent 60.0  37 - 80 %G   RBC 5.23  4.04 - 5.48 M/uL   Hemoglobin 14.9  12.2 - 16.2 g/dL   HCT, POC 46.3  37.7 - 47.9 %   MCV 88.5  80 - 97 fL   MCH, POC 28.6  27 - 31.2 pg   MCHC 32.3  31.8 - 35.4 g/dL   RDW, POC 13.6     Platelet Count, POC 254  142 - 424 K/uL   MPV 6.8  0 - 99.8 fL   Assessment & Plan:   Suggested pt make  appointment with Dr. Fuller Plan to f/u on her GI complaints. Refilled both Protonix and Naproxen. Recommended pt takes periods of time when she does not take the Naproxen. Routine labs done today along with pt receiving a Tdap. Patient agrees to recheck her blood work in 6 months.. I personally performed the services described in this documentation, which was scribed in my presence. The recorded information has been reviewed and is accurate.

## 2014-05-24 LAB — VITAMIN D 25 HYDROXY (VIT D DEFICIENCY, FRACTURES): Vit D, 25-Hydroxy: 54 ng/mL (ref 30–89)

## 2014-05-26 ENCOUNTER — Telehealth: Payer: Self-pay

## 2014-05-26 NOTE — Telephone Encounter (Signed)
See labs 

## 2014-05-26 NOTE — Telephone Encounter (Signed)
Patient returned call regarding lab results. Cb# [id number]

## 2014-12-29 ENCOUNTER — Other Ambulatory Visit: Payer: Self-pay | Admitting: Emergency Medicine

## 2015-03-31 ENCOUNTER — Other Ambulatory Visit: Payer: Self-pay | Admitting: Emergency Medicine

## 2015-05-04 ENCOUNTER — Ambulatory Visit (INDEPENDENT_AMBULATORY_CARE_PROVIDER_SITE_OTHER): Payer: Commercial Managed Care - HMO | Admitting: Emergency Medicine

## 2015-05-04 VITALS — BP 114/76 | HR 82 | Temp 98.6°F | Resp 14 | Ht 69.25 in | Wt 148.8 lb

## 2015-05-04 DIAGNOSIS — Z13220 Encounter for screening for lipoid disorders: Secondary | ICD-10-CM

## 2015-05-04 DIAGNOSIS — G2581 Restless legs syndrome: Secondary | ICD-10-CM | POA: Diagnosis not present

## 2015-05-04 DIAGNOSIS — K219 Gastro-esophageal reflux disease without esophagitis: Secondary | ICD-10-CM | POA: Diagnosis not present

## 2015-05-04 DIAGNOSIS — G4700 Insomnia, unspecified: Secondary | ICD-10-CM | POA: Diagnosis not present

## 2015-05-04 LAB — POCT CBC
Granulocyte percent: 64.2 %G (ref 37–80)
HCT, POC: 43.5 % (ref 37.7–47.9)
Hemoglobin: 14.1 g/dL (ref 12.2–16.2)
Lymph, poc: 1.3 (ref 0.6–3.4)
MCH, POC: 27.7 pg (ref 27–31.2)
MCHC: 32.5 g/dL (ref 31.8–35.4)
MCV: 85.3 fL (ref 80–97)
MID (CBC): 0.4 (ref 0–0.9)
MPV: 6.4 fL (ref 0–99.8)
POC Granulocyte: 3 (ref 2–6.9)
POC LYMPH %: 28.1 % (ref 10–50)
POC MID %: 7.7 % (ref 0–12)
Platelet Count, POC: 299 10*3/uL (ref 142–424)
RBC: 5.1 M/uL (ref 4.04–5.48)
RDW, POC: 13.3 %
WBC: 4.6 10*3/uL (ref 4.6–10.2)

## 2015-05-04 LAB — COMPLETE METABOLIC PANEL WITH GFR
ALBUMIN: 4.3 g/dL (ref 3.6–5.1)
ALK PHOS: 56 U/L (ref 33–130)
ALT: 10 U/L (ref 6–29)
AST: 20 U/L (ref 10–35)
BILIRUBIN TOTAL: 0.6 mg/dL (ref 0.2–1.2)
BUN: 19 mg/dL (ref 7–25)
CALCIUM: 9.4 mg/dL (ref 8.6–10.4)
CO2: 27 mmol/L (ref 20–31)
Chloride: 103 mmol/L (ref 98–110)
Creat: 0.88 mg/dL (ref 0.50–0.99)
GFR, Est African American: 80 mL/min (ref >=60)
GFR, Est Non African American: 70 mL/min (ref >=60)
Glucose, Bld: 91 mg/dL (ref 65–99)
Potassium: 4.9 mmol/L (ref 3.5–5.3)
Sodium: 142 mmol/L (ref 135–146)
TOTAL PROTEIN: 7.1 g/dL (ref 6.1–8.1)

## 2015-05-04 LAB — LIPID PANEL
Cholesterol: 299 mg/dL — ABNORMAL HIGH (ref 125–200)
HDL: 73 mg/dL (ref >=46)
LDL Cholesterol: 208 mg/dL — ABNORMAL HIGH (ref <130)
Total CHOL/HDL Ratio: 4.1 Ratio (ref <=5.0)
Triglycerides: 89 mg/dL (ref <150)
VLDL: 18 mg/dL (ref <30)

## 2015-05-04 LAB — VITAMIN B12: VITAMIN B 12: 405 pg/mL (ref 211–911)

## 2015-05-04 LAB — FERRITIN: Ferritin: 46 ng/mL (ref 10–291)

## 2015-05-04 LAB — TSH: TSH: 0.987 u[IU]/mL (ref 0.350–4.500)

## 2015-05-04 LAB — MAGNESIUM: Magnesium: 2.2 mg/dL (ref 1.5–2.5)

## 2015-05-04 MED ORDER — ZOLPIDEM TARTRATE 5 MG PO TABS: 5.0000 mg | ORAL_TABLET | Freq: Every evening | ORAL | Status: DC | PRN

## 2015-05-04 MED ORDER — NAPROXEN 500 MG PO TABS: 500.0000 mg | ORAL_TABLET | Freq: Two times a day (BID) | ORAL | Status: DC

## 2015-05-04 MED ORDER — PANTOPRAZOLE SODIUM 40 MG PO TBEC: 40.0000 mg | DELAYED_RELEASE_TABLET | Freq: Two times a day (BID) | ORAL | Status: DC

## 2015-05-04 NOTE — Progress Notes (Addendum)
Patient ID: Rachel Boone, female   DOB: 1951/03/14, 64 y.o.   MRN: [id number]    This chart was scribed for Nena Jordan, MD by [facility], medical scribe at [facility].The patient was seen in exam room 02 and the patient's care was started at 9:17 AM.  Chief Complaint:  Chief Complaint  Patient presents with  . Medication Refill    Protonix, Naprosyn  . Mediation Consult    Ambien  . Restless Leg Syndrome   HPI: Rachel Boone is a 63 y.o. female who reports to [facility] today for medication refill, she needs Protonix and naprosyn. She would also like Ambien to help her sleep, been taking benadryl which has not worked. She has a long history of trouble sleeping, denies depression. The medication for her restless leg syndrome has not been helpful, she would like a referral to neurology. Typically has an issue when she sits down to watch T.V. Pt would also like her cholesterol checked. UTD on all health maintenance. Retired for 16 years now.  Past Medical History  Diagnosis Date  . GERD (gastroesophageal reflux disease)   . Hyperlipidemia   . Endocervical polyp   . Mitral regurgitation     patient denied 10/21/13  . Hammertoe   . Pupil irregular   . Arthritis   . Heart murmur   . Allergy     SEASONAL  . Cataract   . PONV (postoperative nausea and vomiting)   . Hiatal hernia   . Family history of anesthesia complication     Family history of PONV   Past Surgical History  Procedure Laterality Date  . Hammer toe surgery    . Pupilloplasty    . Thumb fusion      LEFT  . Colonoscopy    . Eye surgery    . Cataract extraction      bilateral  . Anterior lateral lumbar fusion 4 levels  07/19/2012    Procedure: ANTERIOR LATERAL LUMBAR FUSION 4 LEVELS;  Surgeon: Hosie Spangle, MD;  Location: [place] [facility];  Service: Neurosurgery;  Laterality: N/A;  Lumbar one-two, lumbar two-three, lumbar three-four, lumbar four-five, lumbar five-sacral one anterolateral lumbar  fusion with Lumbar one-sacral one posterior instrumentation  . Lumbar percutaneous pedicle screw 3 level  07/19/2012    Procedure: LUMBAR PERCUTANEOUS PEDICLE SCREW 3 LEVEL;  Surgeon: Hosie Spangle, MD;  Location: [place] [facility];  Service: Neurosurgery;  Laterality: Bilateral;  Lumbar one-two, lumbar two-three, lumbar three-four, lumbar four-five, lumbar five-sacral one anterolateral lumbar fusion with Lumbar one-sacral one posterior instrumentation  . Shoulder arthroscopy Left 10/22/2013    Procedure: ARTHROSCOPY SHOULDER;  Surgeon: Newt Minion, MD;  Location: [place];  Service: Orthopedics;  Laterality: Left;  Left Shoulder Arthroscopy, Debridement, and Decompression, subacromial decompression and distal clavicle resection   Social History   Social History  . Marital Status: Single    Spouse Name: N/A  . Number of Children: N/A  . Years of Education: N/A   Occupational History  . Retired     [employer]   Social History Main Topics  . Smoking status: Former Smoker    Quit date: 09/12/1989  . Smokeless tobacco: Never Used  . Alcohol Use: Yes     Comment: social beer  . Drug Use: No  . Sexual Activity: Not Asked   Other Topics Concern  . None   Social History Narrative   Daily caffeine use    Family  History  Problem Relation Age of Onset  . Colon polyps Sister     adenomatous  . Lung cancer Mother   . Diabetes type II Mother   . Hypertension Brother   . Hypertension Sister   . Diabetes type II Sister   . Heart disease Father   . Colon cancer Neg Hx    Allergies  Allergen Reactions  . Other     Anesthesia=Nausea/Vomiting   Prior to Admission medications   Medication Sig Start Date End Date Taking? Authorizing Provider  diphenhydrAMINE (BENADRYL) 25 MG tablet Take 25 mg by mouth at bedtime as needed for sleep.   Yes Historical Provider, MD  naproxen (NAPROSYN) 500 MG tablet Take 1 tablet (500 mg total) by mouth 2 (two) times daily with a meal.  05/23/14  Yes Darlyne Russian, MD  pantoprazole (PROTONIX) 40 MG tablet TAKE 1 TABLET BY MOUTH TWICE A DAY 04/02/15  Yes Darlyne Russian, MD   ROS: The patient denies fevers, chills, night sweats, unintentional weight loss, chest pain, palpitations, wheezing, dyspnea on exertion, nausea, vomiting, abdominal pain, dysuria, hematuria, melena, numbness, weakness, or tingling.   All other systems have been reviewed and were otherwise negative with the exception of those mentioned in the HPI and as above.    PHYSICAL EXAM: Filed Vitals:   05/04/15 0852  BP: 114/76  Pulse: 82  Temp: 98.6 F (37 C)  Resp: 14   Body mass index is 21.81 kg/(m^2).   General: Alert, no acute distress HEENT:  Normocephalic, atraumatic, oropharynx patent. Eye: EOMI, PEERLDC, defect of the right iris. Cardiovascular:  Regular rate and rhythm, no rubs murmurs or gallops.  No Carotid bruits, radial pulse intact. No pedal edema.  Respiratory: Clear to auscultation bilaterally.  No wheezes, rales, or rhonchi.  No cyanosis, no use of accessory musculature Abdominal: No organomegaly, abdomen is soft and non-tender, positive bowel sounds.  No masses. Musculoskeletal: Gait intact. No edema, tenderness Skin: No rashes. Neurologic: Facial musculature symmetric. Psychiatric: Patient acts appropriately throughout our interaction. Lymphatic: No cervical or submandibular lymphadenopathy.  LABS: Results for orders placed or performed in visit on 05/23/14  Comprehensive metabolic panel  Result Value Ref Range   Sodium 139 135 - 145 mEq/L   Potassium 4.7 3.5 - 5.3 mEq/L   Chloride 102 96 - 112 mEq/L   CO2 28 19 - 32 mEq/L   Glucose, Bld 83 70 - 99 mg/dL   BUN 17 6 - 23 mg/dL   Creat 0.82 0.50 - 1.10 mg/dL   Total Bilirubin 0.5 0.2 - 1.2 mg/dL   Alkaline Phosphatase 58 39 - 117 U/L   AST 19 0 - 37 U/L   ALT 8 0 - 35 U/L   Total Protein 7.2 6.0 - 8.3 g/dL   Albumin 4.5 3.5 - 5.2 g/dL   Calcium 9.6 8.4 - 10.5 mg/dL  Lipid  panel  Result Value Ref Range   Cholesterol 284 (H) 0 - 200 mg/dL   Triglycerides 69 <150 mg/dL   HDL 66 >39 mg/dL   Total CHOL/HDL Ratio 4.3 Ratio   VLDL 14 0 - 40 mg/dL   LDL Cholesterol 204 (H) 0 - 99 mg/dL  Magnesium  Result Value Ref Range   Magnesium 2.1 1.5 - 2.5 mg/dL  Vit D  25 hydroxy (rtn osteoporosis monitoring)  Result Value Ref Range   Vit D, 25-Hydroxy 54 30 - 89 ng/mL  Ferritin  Result Value Ref Range   Ferritin 46 10 - 291  ng/mL  Vitamin B12  Result Value Ref Range   Vitamin B-12 408 211 - 911 pg/mL  POCT CBC  Result Value Ref Range   WBC 4.8 4.6 - 10.2 K/uL   Lymph, poc 1.6 0.6 - 3.4   POC LYMPH PERCENT 32.3 10 - 50 %L   MID (cbc) 0.4 0 - 0.9   POC MID % 7.7 0 - 12 %M   POC Granulocyte 2.9 2 - 6.9   Granulocyte percent 60.0 37 - 80 %G   RBC 5.23 4.04 - 5.48 M/uL   Hemoglobin 14.9 12.2 - 16.2 g/dL   HCT, POC 46.3 37.7 - 47.9 %   MCV 88.5 80 - 97 fL   MCH, POC 28.6 27 - 31.2 pg   MCHC 32.3 31.8 - 35.4 g/dL   RDW, POC 13.6 %   Platelet Count, POC 254 142 - 424 K/uL   MPV 6.8 0 - 99.8 fL   ASSESSMENT/PLAN: Patient in for just a general checkup. She has had a long history of insomnia. She typically takes Benadryl to help at night but this is no longer working. She would like to try Ambien and see if that helps. She was advised she cannot take this every night and only take sparingly to help with sleep. She also battles with restless leg syndrome. Medications have not been successful in helping her. Referral will be made to neurology for their help.I personally performed the services described in this documentation, which was scribed in my presence. The recorded information has been reviewed and is accurate.  Gross sideeffects, risk and benefits, and alternatives of medications d/w patient. Patient is aware that all medications have potential sideeffects and we are unable to predict every sideeffect or drug-drug interaction that may occur.    Arlyss Queen  MD 05/04/2015 8:59 AM

## 2015-05-05 LAB — VITAMIN D 25 HYDROXY (VIT D DEFICIENCY, FRACTURES): Vit D, 25-Hydroxy: 36 ng/mL (ref 30–100)

## 2015-05-07 ENCOUNTER — Telehealth: Payer: Self-pay | Admitting: Family Medicine

## 2015-05-07 NOTE — Telephone Encounter (Signed)
-----   Message from Constance Goltz, [place] sent at 05/06/2015  2:42 PM EDT -----   ----- Message -----    From: Darlyne Russian, MD    Sent: 05/05/2015  12:27 PM      To: [facility] Clinical Message Pool  Please document in the chart patient declines to take a statin.

## 2015-05-07 NOTE — Telephone Encounter (Signed)
Please document in the chart patient declines to take a statin.

## 2015-05-29 DIAGNOSIS — Z860100 Personal history of colon polyps, unspecified: Secondary | ICD-10-CM

## 2015-05-29 HISTORY — DX: Personal history of colonic polyps: Z86.010

## 2015-05-29 HISTORY — DX: Personal history of colon polyps, unspecified: Z86.0100

## 2015-06-16 ENCOUNTER — Encounter: Payer: Self-pay | Admitting: Emergency Medicine

## 2015-07-30 ENCOUNTER — Other Ambulatory Visit: Payer: Self-pay | Admitting: Emergency Medicine

## 2015-08-03 NOTE — Telephone Encounter (Signed)
Faxed

## 2015-10-03 ENCOUNTER — Other Ambulatory Visit: Payer: Self-pay | Admitting: Emergency Medicine

## 2015-10-06 NOTE — Telephone Encounter (Signed)
Faxed

## 2015-10-07 ENCOUNTER — Other Ambulatory Visit: Payer: Self-pay | Admitting: Physician Assistant

## 2015-10-07 DIAGNOSIS — L821 Other seborrheic keratosis: Secondary | ICD-10-CM | POA: Diagnosis not present

## 2015-10-07 DIAGNOSIS — L309 Dermatitis, unspecified: Secondary | ICD-10-CM | POA: Diagnosis not present

## 2015-10-07 DIAGNOSIS — L280 Lichen simplex chronicus: Secondary | ICD-10-CM | POA: Diagnosis not present

## 2015-10-07 DIAGNOSIS — D485 Neoplasm of uncertain behavior of skin: Secondary | ICD-10-CM | POA: Diagnosis not present

## 2015-10-21 DIAGNOSIS — L280 Lichen simplex chronicus: Secondary | ICD-10-CM | POA: Diagnosis not present

## 2015-10-21 DIAGNOSIS — L821 Other seborrheic keratosis: Secondary | ICD-10-CM | POA: Diagnosis not present

## 2015-10-28 ENCOUNTER — Other Ambulatory Visit: Payer: Self-pay | Admitting: Emergency Medicine

## 2016-02-23 ENCOUNTER — Other Ambulatory Visit: Payer: Self-pay | Admitting: Emergency Medicine

## 2016-02-25 ENCOUNTER — Other Ambulatory Visit: Payer: Self-pay | Admitting: Emergency Medicine

## 2016-03-16 ENCOUNTER — Encounter: Payer: Self-pay | Admitting: Gastroenterology

## 2016-09-09 ENCOUNTER — Telehealth: Payer: Self-pay | Admitting: Gastroenterology

## 2016-09-09 ENCOUNTER — Encounter: Payer: Self-pay | Admitting: Gastroenterology

## 2016-09-09 NOTE — Telephone Encounter (Signed)
Unfortunately may not be a direct.  Will needs to keep the office visit.  If she needs to be seen sooner rather than later can have a APP visit. Patient notified.  She will keep appt as scheudled

## 2016-10-21 DIAGNOSIS — M549 Dorsalgia, unspecified: Secondary | ICD-10-CM | POA: Diagnosis not present

## 2016-10-21 DIAGNOSIS — M47816 Spondylosis without myelopathy or radiculopathy, lumbar region: Secondary | ICD-10-CM | POA: Diagnosis not present

## 2016-10-21 DIAGNOSIS — M546 Pain in thoracic spine: Secondary | ICD-10-CM | POA: Diagnosis not present

## 2016-10-21 DIAGNOSIS — Z981 Arthrodesis status: Secondary | ICD-10-CM | POA: Diagnosis not present

## 2016-10-21 DIAGNOSIS — M5416 Radiculopathy, lumbar region: Secondary | ICD-10-CM | POA: Diagnosis not present

## 2016-10-21 DIAGNOSIS — M5136 Other intervertebral disc degeneration, lumbar region: Secondary | ICD-10-CM | POA: Diagnosis not present

## 2016-10-25 ENCOUNTER — Ambulatory Visit: Payer: Self-pay | Admitting: Gastroenterology

## 2016-10-25 ENCOUNTER — Other Ambulatory Visit: Payer: Self-pay | Admitting: Neurosurgery

## 2016-10-25 DIAGNOSIS — Z981 Arthrodesis status: Secondary | ICD-10-CM

## 2016-10-26 ENCOUNTER — Ambulatory Visit
Admission: RE | Admit: 2016-10-26 | Discharge: 2016-10-26 | Disposition: A | Discharge status: Home / Self Care | Payer: PPO | Source: Ambulatory Visit | Attending: Neurosurgery | Admitting: Neurosurgery

## 2016-10-26 DIAGNOSIS — Z981 Arthrodesis status: Secondary | ICD-10-CM

## 2016-10-26 DIAGNOSIS — M4326 Fusion of spine, lumbar region: Secondary | ICD-10-CM | POA: Diagnosis not present

## 2016-10-27 DIAGNOSIS — M5416 Radiculopathy, lumbar region: Secondary | ICD-10-CM | POA: Diagnosis not present

## 2016-10-27 DIAGNOSIS — M48061 Spinal stenosis, lumbar region without neurogenic claudication: Secondary | ICD-10-CM | POA: Diagnosis not present

## 2016-11-02 DIAGNOSIS — M47816 Spondylosis without myelopathy or radiculopathy, lumbar region: Secondary | ICD-10-CM | POA: Diagnosis not present

## 2016-11-02 DIAGNOSIS — M5416 Radiculopathy, lumbar region: Secondary | ICD-10-CM | POA: Diagnosis not present

## 2016-11-02 DIAGNOSIS — Z981 Arthrodesis status: Secondary | ICD-10-CM | POA: Diagnosis not present

## 2016-11-02 DIAGNOSIS — M5136 Other intervertebral disc degeneration, lumbar region: Secondary | ICD-10-CM | POA: Diagnosis not present

## 2016-11-09 DIAGNOSIS — S76312A Strain of muscle, fascia and tendon of the posterior muscle group at thigh level, left thigh, initial encounter: Secondary | ICD-10-CM | POA: Diagnosis not present

## 2016-11-09 DIAGNOSIS — G5702 Lesion of sciatic nerve, left lower limb: Secondary | ICD-10-CM | POA: Diagnosis not present

## 2016-11-16 DIAGNOSIS — S76312A Strain of muscle, fascia and tendon of the posterior muscle group at thigh level, left thigh, initial encounter: Secondary | ICD-10-CM | POA: Diagnosis not present

## 2016-11-16 DIAGNOSIS — M791 Myalgia: Secondary | ICD-10-CM | POA: Diagnosis not present

## 2017-01-13 ENCOUNTER — Ambulatory Visit (INDEPENDENT_AMBULATORY_CARE_PROVIDER_SITE_OTHER): Payer: PPO | Admitting: Emergency Medicine

## 2017-01-13 ENCOUNTER — Encounter: Payer: Self-pay | Admitting: Emergency Medicine

## 2017-01-13 VITALS — BP 121/74 | HR 66 | Temp 98.3°F | Resp 18 | Ht 66.34 in | Wt 154.0 lb

## 2017-01-13 DIAGNOSIS — R635 Abnormal weight gain: Secondary | ICD-10-CM | POA: Diagnosis not present

## 2017-01-13 DIAGNOSIS — R221 Localized swelling, mass and lump, neck: Secondary | ICD-10-CM

## 2017-01-13 NOTE — Patient Instructions (Signed)
     IF you received an x-ray today, you will receive an invoice from [facility]. Please contact [facility] at [phone] with questions or concerns regarding your invoice.   IF you received labwork today, you will receive an invoice from [facility]. Please contact [facility] at [phone] with questions or concerns regarding your invoice.   Our billing staff will not be able to assist you with questions regarding bills from these companies.  You will be contacted with the lab results as soon as they are available. The fastest way to get your results is to activate your My Chart account. Instructions are located on the last page of this paperwork. If you have not heard from us regarding the results in 2 weeks, please contact this office.     

## 2017-01-13 NOTE — Progress Notes (Signed)
Rachel Boone 66 y.o.   Chief Complaint  Patient presents with  . Lump on throat  . Weight Gain    HISTORY OF PRESENT ILLNESS: This is a 66 y.o. female complaining of painless lump in the neck x several weeks; also c/o 15 lb weight gain over the past 3 months; walks 4 miles daily except Sundays; no new meds; no diet changes; drinks 1 beer/day. Non-smoker. No other significant symptoms.  HPI   Prior to Admission medications   Medication Sig Start Date End Date Taking? Authorizing Provider  diphenhydrAMINE (BENADRYL) 25 MG tablet Take 25 mg by mouth at bedtime as needed for sleep.   Yes Historical Provider, MD  naproxen (NAPROSYN) 500 MG tablet TAKE ONE TABLET BY MOUTH TWICE DAILY WITH A MEAL 10/28/15  Yes Darlyne Russian, MD  pantoprazole (PROTONIX) 40 MG tablet Take 1 tablet (40 mg total) by mouth 2 (two) times daily. 05/04/15  Yes Darlyne Russian, MD  zolpidem (AMBIEN) 5 MG tablet TAKE ONE TABLET BY MOUTH AT BEDTIME AS NEEDED FOR SLEEP 10/05/15  Yes Darlyne Russian, MD    Allergies  Allergen Reactions  . Other     Anesthesia=Nausea/Vomiting    Patient Active Problem List   Diagnosis Date Noted  . Gastro-esophageal reflux 05/23/2014  . Arthritis 05/23/2014    Past Medical History:  Diagnosis Date  . Allergy    SEASONAL  . Arthritis   . Cataract   . Endocervical polyp   . Family history of anesthesia complication    Family history of PONV  . GERD (gastroesophageal reflux disease)   . Hammertoe   . Heart murmur   . Hiatal hernia   . Hyperlipidemia   . Mitral regurgitation    patient denied 10/21/13  . PONV (postoperative nausea and vomiting)   . Pupil irregular     Past Surgical History:  Procedure Laterality Date  . ANTERIOR LATERAL LUMBAR FUSION 4 LEVELS  07/19/2012   Procedure: ANTERIOR LATERAL LUMBAR FUSION 4 LEVELS;  Surgeon: Hosie Spangle, MD;  Location: [place] [facility];  Service: Neurosurgery;  Laterality: N/A;  Lumbar one-two, lumbar two-three, lumbar three-four,  lumbar four-five, lumbar five-sacral one anterolateral lumbar fusion with Lumbar one-sacral one posterior instrumentation  . CATARACT EXTRACTION     bilateral  . COLONOSCOPY    . EYE SURGERY    . HAMMER TOE SURGERY    . LUMBAR PERCUTANEOUS PEDICLE SCREW 3 LEVEL  07/19/2012   Procedure: LUMBAR PERCUTANEOUS PEDICLE SCREW 3 LEVEL;  Surgeon: Hosie Spangle, MD;  Location: [place] [facility];  Service: Neurosurgery;  Laterality: Bilateral;  Lumbar one-two, lumbar two-three, lumbar three-four, lumbar four-five, lumbar five-sacral one anterolateral lumbar fusion with Lumbar one-sacral one posterior instrumentation  . PUPILLOPLASTY    . SHOULDER ARTHROSCOPY Left 10/22/2013   Procedure: ARTHROSCOPY SHOULDER;  Surgeon: Newt Minion, MD;  Location: [facility] [facility];  Service: Orthopedics;  Laterality: Left;  Left Shoulder Arthroscopy, Debridement, and Decompression, subacromial decompression and distal clavicle resection  . THUMB FUSION     LEFT    Social History   Social History  . Marital status: Single    Spouse name: N/A  . Number of children: N/A  . Years of education: N/A   Occupational History  . Retired     [employer]   Social History Main Topics  . Smoking status: Former Smoker    Quit date: 09/12/1989  . Smokeless tobacco: Never Used  . Alcohol use Yes  Comment: social beer  . Drug use: No  . Sexual activity: Not on file   Other Topics Concern  . Not on file   Social History Narrative   Daily caffeine use     Family History  Problem Relation Age of Onset  . Lung cancer Mother   . Diabetes type II Mother   . Heart disease Father   . Colon polyps Sister     adenomatous  . Hypertension Brother   . Hypertension Sister   . Diabetes type II Sister   . Colon cancer Neg Hx      Review of Systems  Constitutional: Negative.  Negative for chills, fever and malaise/fatigue.       +weight gain.  HENT: Negative.  Negative for congestion, ear pain, nosebleeds and  sore throat.   Eyes: Negative.  Negative for blurred vision, double vision, discharge and redness.  Respiratory: Negative for cough, shortness of breath and wheezing.   Cardiovascular: Negative.  Negative for chest pain, palpitations and claudication.  Gastrointestinal: Negative for abdominal pain, diarrhea, nausea and vomiting.  Genitourinary: Negative for dysuria and hematuria.  Musculoskeletal: Negative for myalgias and neck pain.  Skin: Negative.  Negative for rash.  Neurological: Negative.  Negative for dizziness, weakness and headaches.  Endo/Heme/Allergies: Negative.    Vitals:   01/13/17 1130  BP: 121/74  Pulse: 66  Resp: 18  Temp: 98.3 F (36.8 C)     Physical Exam  Constitutional: She is oriented to person, place, and time. She appears well-developed and well-nourished.  HENT:  Head: Normocephalic and atraumatic.  Right Ear: External ear normal.  Left Ear: External ear normal.  Nose: Nose normal.  Mouth/Throat: Oropharynx is clear and moist.  Eyes: Conjunctivae and EOM are normal. Pupils are equal, round, and reactive to light.  Neck: Normal range of motion. Neck supple. No JVD present. No thyromegaly present.  Non-tender small lump (cyst) felt in front of mid neck, not thyroidal  Cardiovascular: Normal rate and regular rhythm.   Murmur (systolic 6-7/6 at aortic area) heard. Pulmonary/Chest: Effort normal and breath sounds normal.  Abdominal: Soft. Bowel sounds are normal. She exhibits no distension. There is no tenderness.  Musculoskeletal: Normal range of motion.  Lymphadenopathy:    She has no cervical adenopathy.  Neurological: She is alert and oriented to person, place, and time. No sensory deficit. She exhibits normal muscle tone.  Skin: Skin is warm and dry. Capillary refill takes less than 2 seconds. No rash noted.  Psychiatric: She has a normal mood and affect. Her behavior is normal.  Vitals reviewed.    ASSESSMENT & PLAN: Zigmund Daniel was seen today for lump  on throat and weight gain.  Diagnoses and all orders for this visit:  Lump in neck -     US Soft Tissue Head/Neck; Future  Weight gain -     CBC with Differential/Platelet -     Comprehensive metabolic panel -     TSH -     Hemoglobin A1c      Agustina Caroli, MD [facility]

## 2017-01-14 LAB — CBC WITH DIFFERENTIAL/PLATELET
BASOS ABS: 0 10*3/uL (ref 0.0–0.2)
BASOS: 0 %
EOS (ABSOLUTE): 0.2 10*3/uL (ref 0.0–0.4)
Eos: 3 %
Hematocrit: 42.3 % (ref 34.0–46.6)
Hemoglobin: 13.9 g/dL (ref 11.1–15.9)
Immature Grans (Abs): 0 10*3/uL (ref 0.0–0.1)
Immature Granulocytes: 0 %
LYMPHS ABS: 1.6 10*3/uL (ref 0.7–3.1)
Lymphs: 28 %
MCH: 29 pg (ref 26.6–33.0)
MCHC: 32.9 g/dL (ref 31.5–35.7)
MCV: 88 fL (ref 79–97)
MONOS ABS: 0.2 10*3/uL (ref 0.1–0.9)
Monocytes: 4 %
NEUTROS ABS: 3.6 10*3/uL (ref 1.4–7.0)
Neutrophils: 65 %
PLATELETS: 268 10*3/uL (ref 150–379)
RBC: 4.79 x10E6/uL (ref 3.77–5.28)
RDW: 13.6 % (ref 12.3–15.4)
WBC: 5.6 10*3/uL (ref 3.4–10.8)

## 2017-01-14 LAB — COMPREHENSIVE METABOLIC PANEL
A/G RATIO: 1.8 (ref 1.2–2.2)
ALK PHOS: 55 IU/L (ref 39–117)
ALT: 12 IU/L (ref 0–32)
AST: 27 IU/L (ref 0–40)
Albumin: 4.4 g/dL (ref 3.6–4.8)
BILIRUBIN TOTAL: 0.3 mg/dL (ref 0.0–1.2)
BUN/Creatinine Ratio: 19 (ref 12–28)
BUN: 15 mg/dL (ref 8–27)
CHLORIDE: 100 mmol/L (ref 96–106)
CO2: 26 mmol/L (ref 18–29)
Calcium: 9.8 mg/dL (ref 8.7–10.3)
Creatinine, Ser: 0.79 mg/dL (ref 0.57–1.00)
GFR calc non Af Amer: 78 mL/min/{1.73_m2} (ref >59)
GFR, EST AFRICAN AMERICAN: 90 mL/min/{1.73_m2} (ref >59)
GLUCOSE: 86 mg/dL (ref 65–99)
Globulin, Total: 2.5 g/dL (ref 1.5–4.5)
POTASSIUM: 4.7 mmol/L (ref 3.5–5.2)
Sodium: 142 mmol/L (ref 134–144)
TOTAL PROTEIN: 6.9 g/dL (ref 6.0–8.5)

## 2017-01-14 LAB — HEMOGLOBIN A1C
ESTIMATED AVERAGE GLUCOSE: 111 mg/dL
Hgb A1c MFr Bld: 5.5 % (ref 4.8–5.6)

## 2017-01-14 LAB — TSH: TSH: 1.08 u[IU]/mL (ref 0.450–4.500)

## 2017-01-26 ENCOUNTER — Other Ambulatory Visit: Payer: Self-pay | Admitting: Emergency Medicine

## 2017-01-26 DIAGNOSIS — K219 Gastro-esophageal reflux disease without esophagitis: Secondary | ICD-10-CM

## 2017-01-26 MED ORDER — PANTOPRAZOLE SODIUM 40 MG PO TBEC: 40.0000 mg | DELAYED_RELEASE_TABLET | Freq: Two times a day (BID) | ORAL | 3 refills | Status: DC

## 2017-01-26 NOTE — Telephone Encounter (Signed)
Patient called back stating she needs a 3 month supply of these and that she takes them twice a day

## 2017-01-26 NOTE — Telephone Encounter (Signed)
Patient saw dr Kittie Plater on 01/13/17 and she thought he was going to call her in some pantoprazole (PROTONIX) 40 MG tablet but she states nothing was ever called in. She was wondering if she could get this sent to the [facility] in [facility] as soon as possible.  Thank you!

## 2017-02-01 ENCOUNTER — Telehealth: Payer: Self-pay | Admitting: Emergency Medicine

## 2017-02-01 DIAGNOSIS — K219 Gastro-esophageal reflux disease without esophagitis: Secondary | ICD-10-CM

## 2017-02-01 NOTE — Telephone Encounter (Signed)
The current rx states bid?

## 2017-02-01 NOTE — Telephone Encounter (Signed)
Pt is checking on status of her med refill request on 01/26/17   Best number [phone]

## 2017-02-01 NOTE — Telephone Encounter (Signed)
Pt uses tid, is this ok? protonix

## 2017-02-01 NOTE — Telephone Encounter (Signed)
Pt advised bid is at [facility] since 01/26/17

## 2017-02-01 NOTE — Telephone Encounter (Signed)
Dr. Everlene Farrier prescribed it for twice a day. OK for twice a day but not tid.

## 2017-02-01 NOTE — Telephone Encounter (Signed)
Protonix is a once day medication; should not be used tid.

## 2017-02-02 MED ORDER — PANTOPRAZOLE SODIUM 40 MG PO TBEC: 40.0000 mg | DELAYED_RELEASE_TABLET | Freq: Two times a day (BID) | ORAL | 3 refills | Status: DC

## 2017-02-08 ENCOUNTER — Telehealth: Payer: Self-pay

## 2017-02-08 MED ORDER — ESOMEPRAZOLE MAGNESIUM 40 MG PO CPDR: 40.0000 mg | DELAYED_RELEASE_CAPSULE | Freq: Every day | ORAL | 3 refills | Status: DC

## 2017-02-08 NOTE — Telephone Encounter (Signed)
See 02/01/17 message Insurance will only cover nexium 1 qd  [phone]  For [place] #[id number]

## 2017-02-08 NOTE — Telephone Encounter (Signed)
Then she should be on Nexium. Ordered.

## 2017-06-09 ENCOUNTER — Ambulatory Visit: Payer: PPO

## 2017-07-28 ENCOUNTER — Telehealth: Payer: Self-pay

## 2017-07-28 NOTE — Telephone Encounter (Signed)
Called patient to reschedule AWV appt.    Josepha Pigg, B.A.  Care Guide - Primary Care at [place]

## 2017-08-30 ENCOUNTER — Telehealth: Payer: Self-pay | Admitting: Emergency Medicine

## 2017-08-30 NOTE — Telephone Encounter (Signed)
Copied from [place] [id number]. Topic: General - Other >> Aug 30, 2017  8:43 AM Darl Householder, RMA wrote: Reason for CRM: patient is requesting a call back concerning her Ultrasound referral from May 2018 she was never scheduled and she wants to know if we can put in another order, please return pt call

## 2017-08-31 NOTE — Telephone Encounter (Signed)
Do you want to see patient again before another referral is put in?

## 2017-09-01 NOTE — Telephone Encounter (Signed)
I want to see her again after she gets the ultrasound as requested back in May. Thanks.

## 2017-09-14 NOTE — Telephone Encounter (Signed)
Left detailed message with number to ultrasound scheduling [phone] .   When ultrasound is completed schedule and appointment with Dr. Pamella Pert for a follow up.

## 2017-09-20 ENCOUNTER — Ambulatory Visit (HOSPITAL_COMMUNITY)
Admission: RE | Admit: 2017-09-20 | Discharge: 2017-09-20 | Disposition: A | Discharge status: Home / Self Care | Payer: PPO | Source: Ambulatory Visit | Attending: Emergency Medicine | Admitting: Emergency Medicine

## 2017-09-20 DIAGNOSIS — R221 Localized swelling, mass and lump, neck: Secondary | ICD-10-CM | POA: Diagnosis not present

## 2017-09-21 ENCOUNTER — Encounter: Payer: Self-pay | Admitting: Radiology

## 2018-03-23 ENCOUNTER — Ambulatory Visit (INDEPENDENT_AMBULATORY_CARE_PROVIDER_SITE_OTHER): Payer: PPO | Admitting: Family Medicine

## 2018-03-23 ENCOUNTER — Other Ambulatory Visit: Payer: Self-pay

## 2018-03-23 ENCOUNTER — Encounter: Payer: Self-pay | Admitting: Family Medicine

## 2018-03-23 VITALS — BP 132/82 | HR 90 | Temp 99.6°F | Resp 16 | Ht 67.72 in | Wt 150.0 lb

## 2018-03-23 DIAGNOSIS — S1086XA Insect bite of other specified part of neck, initial encounter: Secondary | ICD-10-CM | POA: Diagnosis not present

## 2018-03-23 DIAGNOSIS — W57XXXA Bitten or stung by nonvenomous insect and other nonvenomous arthropods, initial encounter: Secondary | ICD-10-CM | POA: Diagnosis not present

## 2018-03-23 DIAGNOSIS — T7840XA Allergy, unspecified, initial encounter: Secondary | ICD-10-CM

## 2018-03-23 MED ORDER — METHYLPREDNISOLONE SODIUM SUCC 125 MG IJ SOLR
125.0000 mg | Freq: Once | INTRAMUSCULAR | Status: AC
  Administered 2018-03-23: 125 mg via INTRAMUSCULAR

## 2018-03-23 MED ORDER — PREDNISONE 20 MG PO TABS: 20.0000 mg | ORAL_TABLET | Freq: Two times a day (BID) | ORAL | 0 refills | Status: DC

## 2018-03-23 MED ORDER — LORATADINE 10 MG PO TABS: 10.0000 mg | ORAL_TABLET | Freq: Every day | ORAL | 0 refills | Status: DC

## 2018-03-23 MED ORDER — HYDROXYZINE HCL 25 MG PO TABS: 25.0000 mg | ORAL_TABLET | Freq: Three times a day (TID) | ORAL | 0 refills | Status: DC | PRN

## 2018-03-23 NOTE — Progress Notes (Signed)
Subjective:    Patient ID: Rachel Boone, female    DOB: July 11, 1951, 67 y.o.   MRN: [id number]  03/23/2018  Rash (pt states she noticed Jeris Penta.  she was bite by a bug on her neck were the rash stared now the rash is all over her body. Pt states rash is red and itchy. )    HPI This 67 y.o. female presents for evaluation of diffuse pruritic rash all of her body.  Patient does landscaping regularly and noticed a bug bite bit her left posterior neck 48 hours prior to visit.  She developed a rash on the left posterior neck immediately following the bug bite.  The rash has progressed to her torso and proximal extremities.  Patient denies fever, chills, sweats.  She denies fatigue, headaches, dizziness, nausea, vomiting.  She denies shortness of breath, tongue swelling, facial swelling.  Patient has been taking Benadryl as needed for itching yet has not been able to sleep or function due to severity of itching.  She denies involvement of palms for mucosal surfaces.  She denies initiation of medications that are new recently.  Denies any new unusual foods. BP Readings from Last 3 Encounters:  03/23/18 132/82  01/13/17 121/74  05/04/15 114/76   Wt Readings from Last 3 Encounters:  03/23/18 150 lb (68 kg)  01/13/17 154 lb (69.9 kg)  05/04/15 148 lb 12.8 oz (67.5 kg)   Immunization History  Administered Date(s) Administered  . Tdap 05/23/2014    Review of Systems  Constitutional: Negative for chills, diaphoresis, fatigue and fever.  HENT: Negative for congestion, ear discharge, ear pain, facial swelling, postnasal drip, rhinorrhea, sore throat and trouble swallowing.   Respiratory: Negative for shortness of breath, wheezing and stridor.   Skin: Positive for rash.  Neurological: Negative for dizziness and headaches.    Past Medical History:  Diagnosis Date  . Allergy    SEASONAL  . Arthritis   . Cataract   . Endocervical polyp   . Family history of anesthesia complication    Family  history of PONV  . GERD (gastroesophageal reflux disease)   . Hammertoe   . Heart murmur   . Hiatal hernia   . Hyperlipidemia   . Mitral regurgitation    patient denied 10/21/13  . PONV (postoperative nausea and vomiting)   . Pupil irregular    Past Surgical History:  Procedure Laterality Date  . ANTERIOR LATERAL LUMBAR FUSION 4 LEVELS  07/19/2012   Procedure: ANTERIOR LATERAL LUMBAR FUSION 4 LEVELS;  Surgeon: Hosie Spangle, MD;  Location: [place] [facility];  Service: Neurosurgery;  Laterality: N/A;  Lumbar one-two, lumbar two-three, lumbar three-four, lumbar four-five, lumbar five-sacral one anterolateral lumbar fusion with Lumbar one-sacral one posterior instrumentation  . CATARACT EXTRACTION     bilateral  . COLONOSCOPY    . EYE SURGERY    . HAMMER TOE SURGERY    . LUMBAR PERCUTANEOUS PEDICLE SCREW 3 LEVEL  07/19/2012   Procedure: LUMBAR PERCUTANEOUS PEDICLE SCREW 3 LEVEL;  Surgeon: Hosie Spangle, MD;  Location: [place] [facility];  Service: Neurosurgery;  Laterality: Bilateral;  Lumbar one-two, lumbar two-three, lumbar three-four, lumbar four-five, lumbar five-sacral one anterolateral lumbar fusion with Lumbar one-sacral one posterior instrumentation  . PUPILLOPLASTY    . SHOULDER ARTHROSCOPY Left 10/22/2013   Procedure: ARTHROSCOPY SHOULDER;  Surgeon: Newt Minion, MD;  Location: [facility];  Service: Orthopedics;  Laterality: Left;  Left Shoulder Arthroscopy, Debridement, and Decompression, subacromial decompression and distal clavicle resection  .  THUMB FUSION     LEFT   Allergies  Allergen Reactions  . Other     Anesthesia=Nausea/Vomiting   Current Outpatient Medications on File Prior to Visit  Medication Sig Dispense Refill  . diphenhydrAMINE (BENADRYL) 25 MG tablet Take 25 mg by mouth at bedtime as needed for sleep.     No current facility-administered medications on file prior to visit.    Social History   Socioeconomic History  . Marital status: Single    Spouse name: Not  on file  . Number of children: Not on file  . Years of education: Not on file  . Highest education level: Not on file  Occupational History  . Occupation: Retired    Comment: [employer]  Social Needs  . Financial resource strain: Not on file  . Food insecurity:    Worry: Not on file    Inability: Not on file  . Transportation needs:    Medical: Not on file    Non-medical: Not on file  Tobacco Use  . Smoking status: Former Smoker    Last attempt to quit: 09/12/1989    Years since quitting: 28.5  . Smokeless tobacco: Never Used  Substance and Sexual Activity  . Alcohol use: Yes    Comment: social beer  . Drug use: No  . Sexual activity: Not on file  Lifestyle  . Physical activity:    Days per week: Not on file    Minutes per session: Not on file  . Stress: Not on file  Relationships  . Social connections:    Talks on phone: Not on file    Gets together: Not on file    Attends religious service: Not on file    Active member of club or organization: Not on file    Attends meetings of clubs or organizations: Not on file    Relationship status: Not on file  . Intimate partner violence:    Fear of current or ex partner: Not on file    Emotionally abused: Not on file    Physically abused: Not on file    Forced sexual activity: Not on file  Other Topics Concern  . Not on file  Social History Narrative   Daily caffeine use    Family History  Problem Relation Age of Onset  . Lung cancer Mother   . Diabetes type II Mother   . Heart disease Father   . Colon polyps Sister        adenomatous  . Hypertension Brother   . Hypertension Sister   . Diabetes type II Sister   . Colon cancer Neg Hx        Objective:    BP 132/82   Pulse 90   Temp 99.6 F (37.6 C) (Oral)   Resp 16   Ht 5' 7.72" (1.72 m)   Wt 150 lb (68 kg)   SpO2 98%   BMI 23.00 kg/m  Physical Exam  Constitutional: She is oriented to person, place, and time. She appears  well-developed and well-nourished. No distress.  HENT:  Head: Normocephalic and atraumatic.  Right Ear: External ear normal.  Left Ear: External ear normal.  Nose: Nose normal.  Mouth/Throat: Oropharynx is clear and moist. No oropharyngeal exudate.  Eyes: Pupils are equal, round, and reactive to light. Conjunctivae are normal.  Neck: Normal range of motion. Neck supple.  Cardiovascular: Normal rate, regular rhythm and normal heart sounds. Exam reveals no gallop and no friction rub.  No murmur heard. Pulmonary/Chest: Effort normal and breath sounds normal. She has no wheezes. She has no rales.  Neurological: She is alert and oriented to person, place, and time.  Skin: Rash noted. No burn and no ecchymosis noted. Rash is maculopapular. She is not diaphoretic.  Diffuse maculopapular rash along torso, proximal extremities, posterior neck.  Psychiatric: She has a normal mood and affect. Her behavior is normal.  Nursing note and vitals reviewed.  No results found. Depression screen [facility] 2/9 03/23/2018 01/13/2017 05/04/2015  Decreased Interest 0 0 0  Down, Depressed, Hopeless 0 0 0  PHQ - 2 Score 0 0 0   Fall Risk  03/23/2018 01/13/2017  Falls in the past year? No No        Assessment & Plan:   1. Allergic reaction, initial encounter   2. Insect bite of other part of neck, initial encounter     Presumed allergic reaction to insect bite: New onset.  Status post Solu-Medrol 125 mg IM in office.  Prescription for prednisone taper provided yet patient may not initiate due to intolerance of prednisone therapies.  Prescription for hydroxyzine provided for as needed use.  Recommend Claritin 10 mg daily.  Consider Zantac 150 milligrams twice daily.  Return to clinic for shortness of breath, facial swelling, throat swelling.  No orders of the defined types were placed in this encounter.  Meds ordered this encounter  Medications  . methylPREDNISolone sodium succinate (SOLU-MEDROL) 125 mg/2 mL  injection 125 mg  . hydrOXYzine (ATARAX/VISTARIL) 25 MG tablet    Sig: Take 1 tablet (25 mg total) by mouth every 8 (eight) hours as needed for itching.    Dispense:  30 tablet    Refill:  0  . loratadine (CLARITIN) 10 MG tablet    Sig: Take 1 tablet (10 mg total) by mouth daily.    Dispense:  30 tablet    Refill:  0  . predniSONE (DELTASONE) 20 MG tablet    Sig: Take 1 tablet (20 mg total) by mouth 2 (two) times daily with a meal. For 5 days then one tablet daily for 5 days    Dispense:  15 tablet    Refill:  0    No follow-ups on file.   Isaish Alemu Elayne Guerin, M.D. Primary Care at [facility] previously [facility] [address] [place], [place]  [address] [phone] phone [phone] fax

## 2018-03-23 NOTE — Patient Instructions (Addendum)
IF you received an x-ray today, you will receive an invoice from [facility]. Please contact [facility] at [phone] with questions or concerns regarding your invoice.   IF you received labwork today, you will receive an invoice from [facility]. Please contact [facility] at [phone] with questions or concerns regarding your invoice.   Our billing staff will not be able to assist you with questions regarding bills from these companies.  You will be contacted with the lab results as soon as they are available. The fastest way to get your results is to activate your My Chart account. Instructions are located on the last page of this paperwork. If you have not heard from [place] regarding the results in 2 weeks, please contact this office.      Insect Bite, Adult An insect bite can make your skin red, itchy, and swollen. An insect bite is different from an insect sting, which happens when an insect injects poison (venom) into the skin. Some insects can spread disease to people through a bite. However, most insect bites do not lead to disease and are not serious. What are the causes? Insects may bite for a variety of reasons, including:  Hunger.  To defend themselves.  Insects that bite include:  Spiders.  Mosquitoes.  Ticks.  Fleas.  Ants.  Flies.  Bedbugs.  What are the signs or symptoms? Symptoms of this condition include:  Itching or pain in the bite area.  Redness and swelling in the bite area.  An open wound (skin ulcer).  In many cases, symptoms last for 2-4 days. How is this diagnosed? This condition is usually diagnosed based on symptoms and a physical exam. How is this treated? Treatment is usually not needed. Symptoms often go away on their own. When treatment is recommended, it may involve:  Applying a cream or lotion to the bitten area. This treatment helps with itching.  Taking an antibiotic medicine. This treatment is needed if  the bite area gets infected.  Getting a tetanus shot.  Applying ice to the affected area.  Medicines called antihistamines. This treatment is needed if you develop an allergic reaction to the insect bite.  Follow these instructions at home: Bite area care  Do not scratch the bite area.  Keep the bite area clean and dry. Wash it every day with soap and water as told by your health care provider.  Check the bite area every day for signs of infection. Check for: ? More redness, swelling, or pain. ? Fluid or blood. ? Warmth. ? Pus. Managing pain, itching, and swelling   You may apply a baking soda paste, cortisone cream, or calamine lotion to the bite area as told by your health care provider.  If directed, applyice to the bite area. ? Put ice in a plastic bag. ? Place a towel between your skin and the bag. ? Leave the ice on for 20 minutes, 2-3 times per day. Medicines  Apply or take over-the-counter and prescription medicines only as told by your health care provider.  If you were prescribed an antibiotic medicine, use it as told by your health care provider. Do not stop using the antibiotic even if your condition improves. General instructions  Keep all follow-up visits as told by your health care provider. This is important. How is this prevented? To help reduce your risk of insect bites:  When you are outdoors, wear clothing that covers your arms and legs.  Use insect repellent. The best  insect repellents contain: ? DEET, picaridin, oil of lemon eucalyptus (OLE), or IR3535. ? Higher amounts of an active ingredient.  If your home windows do not have screens, consider installing them.  Contact a health care provider if:  You have more redness, swelling, or pain in the bite area.  You have fluid, blood, or pus coming from the bite area.  The bite area feels warm to the touch.  You have a fever. Get help right away if:  You have joint pain.  You have a  rash.  You have shortness of breath.  You feel unusually tired or sleepy.  You have neck pain.  You have a headache.  You have unusual weakness.  You have chest pain.  You have nausea, vomiting, or pain in the abdomen. This information is not intended to replace advice given to you by your health care provider. Make sure you discuss any questions you have with your health care provider. Document Released: 10/06/2004 Document Revised: 04/27/2016 Document Reviewed: 03/07/2016 Elsevier Interactive Patient Education  [employer].

## 2018-03-24 ENCOUNTER — Ambulatory Visit: Payer: PPO | Admitting: Emergency Medicine

## 2018-07-24 ENCOUNTER — Ambulatory Visit (INDEPENDENT_AMBULATORY_CARE_PROVIDER_SITE_OTHER): Payer: PPO | Admitting: Emergency Medicine

## 2018-07-24 ENCOUNTER — Encounter: Payer: Self-pay | Admitting: Emergency Medicine

## 2018-07-24 ENCOUNTER — Other Ambulatory Visit: Payer: Self-pay

## 2018-07-24 VITALS — BP 126/76 | HR 72 | Temp 98.8°F | Resp 16 | Ht 66.25 in | Wt 155.0 lb

## 2018-07-24 DIAGNOSIS — K219 Gastro-esophageal reflux disease without esophagitis: Secondary | ICD-10-CM

## 2018-07-24 DIAGNOSIS — N393 Stress incontinence (female) (male): Secondary | ICD-10-CM | POA: Diagnosis not present

## 2018-07-24 DIAGNOSIS — R221 Localized swelling, mass and lump, neck: Secondary | ICD-10-CM

## 2018-07-24 DIAGNOSIS — M1990 Unspecified osteoarthritis, unspecified site: Secondary | ICD-10-CM | POA: Diagnosis not present

## 2018-07-24 DIAGNOSIS — Z1159 Encounter for screening for other viral diseases: Secondary | ICD-10-CM | POA: Diagnosis not present

## 2018-07-24 MED ORDER — NAPROXEN 500 MG PO TABS: 500.0000 mg | ORAL_TABLET | Freq: Two times a day (BID) | ORAL | 1 refills | Status: DC

## 2018-07-24 MED ORDER — PANTOPRAZOLE SODIUM 40 MG PO TBEC: 40.0000 mg | DELAYED_RELEASE_TABLET | Freq: Every day | ORAL | 3 refills | Status: DC

## 2018-07-24 NOTE — Addendum Note (Signed)
Addended by: Davina Poke on: 07/24/2018 10:42 AM   Modules accepted: Orders

## 2018-07-24 NOTE — Patient Instructions (Addendum)
If you have lab work done today you will be contacted with your lab results within the next 2 weeks.  If you have not heard from [place] then please contact us. The fastest way to get your results is to register for My Chart.   IF you received an x-ray today, you will receive an invoice from [facility]. Please contact [facility] at [phone] with questions or concerns regarding your invoice.   IF you received labwork today, you will receive an invoice from [facility]. Please contact [facility] at [phone] with questions or concerns regarding your invoice.   Our billing staff will not be able to assist you with questions regarding bills from these companies.  You will be contacted with the lab results as soon as they are available. The fastest way to get your results is to activate your My Chart account. Instructions are located on the last page of this paperwork. If you have not heard from [place] regarding the results in 2 weeks, please contact this office.    Health Maintenance, Female Adopting a healthy lifestyle and getting preventive care can go a long way to promote health and wellness. Talk with your health care provider about what schedule of regular examinations is right for you. This is a good chance for you to check in with your provider about disease prevention and staying healthy. In between checkups, there are plenty of things you can do on your own. Experts have done a lot of research about which lifestyle changes and preventive measures are most likely to keep you healthy. Ask your health care provider for more information. Weight and diet Eat a healthy diet  Be sure to include plenty of vegetables, fruits, low-fat dairy products, and lean protein.  Do not eat a lot of foods high in solid fats, added sugars, or salt.  Get regular exercise. This is one of the most important things you can do for your health. ? Most adults should exercise for at least 150  minutes each week. The exercise should increase your heart rate and make you sweat (moderate-intensity exercise). ? Most adults should also do strengthening exercises at least twice a week. This is in addition to the moderate-intensity exercise.  Maintain a healthy weight  Body mass index (BMI) is a measurement that can be used to identify possible weight problems. It estimates body fat based on height and weight. Your health care provider can help determine your BMI and help you achieve or maintain a healthy weight.  For females 90 years of age and older: ? A BMI below 18.5 is considered underweight. ? A BMI of 18.5 to 24.9 is normal. ? A BMI of 25 to 29.9 is considered overweight. ? A BMI of 30 and above is considered obese.  Watch levels of cholesterol and blood lipids  You should start having your blood tested for lipids and cholesterol at 68 years of age, then have this test every 5 years.  You may need to have your cholesterol levels checked more often if: ? Your lipid or cholesterol levels are high. ? You are older than 68 years of age. ? You are at high risk for heart disease.  Cancer screening Lung Cancer  Lung cancer screening is recommended for adults 69-37 years old who are at high risk for lung cancer because of a history of smoking.  A yearly low-dose CT scan of the lungs is recommended for people who: ? Currently smoke. ? Have quit within  the past 15 years. ? Have at least a 30-pack-year history of smoking. A pack year is smoking an average of one pack of cigarettes a day for 1 year.  Yearly screening should continue until it has been 15 years since you quit.  Yearly screening should stop if you develop a health problem that would prevent you from having lung cancer treatment.  Breast Cancer  Practice breast self-awareness. This means understanding how your breasts normally appear and feel.  It also means doing regular breast self-exams. Let your health care  provider know about any changes, no matter how small.  If you are in your 20s or 30s, you should have a clinical breast exam (CBE) by a health care provider every 1-3 years as part of a regular health exam.  If you are 40 or older, have a CBE every year. Also consider having a breast X-ray (mammogram) every year.  If you have a family history of breast cancer, talk to your health care provider about genetic screening.  If you are at high risk for breast cancer, talk to your health care provider about having an MRI and a mammogram every year.  Breast cancer gene (BRCA) assessment is recommended for women who have family members with BRCA-related cancers. BRCA-related cancers include: ? Breast. ? Ovarian. ? Tubal. ? Peritoneal cancers.  Results of the assessment will determine the need for genetic counseling and BRCA1 and BRCA2 testing.  Cervical Cancer Your health care provider may recommend that you be screened regularly for cancer of the pelvic organs (ovaries, uterus, and vagina). This screening involves a pelvic examination, including checking for microscopic changes to the surface of your cervix (Pap test). You may be encouraged to have this screening done every 3 years, beginning at age 21.  For women ages 30-65, health care providers may recommend pelvic exams and Pap testing every 3 years, or they may recommend the Pap and pelvic exam, combined with testing for human papilloma virus (HPV), every 5 years. Some types of HPV increase your risk of cervical cancer. Testing for HPV may also be done on women of any age with unclear Pap test results.  Other health care providers may not recommend any screening for nonpregnant women who are considered low risk for pelvic cancer and who do not have symptoms. Ask your health care provider if a screening pelvic exam is right for you.  If you have had past treatment for cervical cancer or a condition that could lead to cancer, you need Pap tests  and screening for cancer for at least 20 years after your treatment. If Pap tests have been discontinued, your risk factors (such as having a new sexual partner) need to be reassessed to determine if screening should resume. Some women have medical problems that increase the chance of getting cervical cancer. In these cases, your health care provider may recommend more frequent screening and Pap tests.  Colorectal Cancer  This type of cancer can be detected and often prevented.  Routine colorectal cancer screening usually begins at 68 years of age and continues through 68 years of age.  Your health care provider may recommend screening at an earlier age if you have risk factors for colon cancer.  Your health care provider may also recommend using home test kits to check for hidden blood in the stool.  A small camera at the end of a tube can be used to examine your colon directly (sigmoidoscopy or colonoscopy). This is done to check for   the earliest forms of colorectal cancer.  Routine screening usually begins at age 50.  Direct examination of the colon should be repeated every 5-10 years through 68 years of age. However, you may need to be screened more often if early forms of precancerous polyps or small growths are found.  Skin Cancer  Check your skin from head to toe regularly.  Tell your health care provider about any new moles or changes in moles, especially if there is a change in a mole's shape or color.  Also tell your health care provider if you have a mole that is larger than the size of a pencil eraser.  Always use sunscreen. Apply sunscreen liberally and repeatedly throughout the day.  Protect yourself by wearing long sleeves, pants, a wide-brimmed hat, and sunglasses whenever you are outside.  Heart disease, diabetes, and high blood pressure  High blood pressure causes heart disease and increases the risk of stroke. High blood pressure is more likely to develop  in: ? People who have blood pressure in the high end of the normal range (130-139/85-89 mm Hg). ? People who are overweight or obese. ? People who are African American.  If you are 18-39 years of age, have your blood pressure checked every 3-5 years. If you are 40 years of age or older, have your blood pressure checked every year. You should have your blood pressure measured twice-once when you are at a hospital or clinic, and once when you are not at a hospital or clinic. Record the average of the two measurements. To check your blood pressure when you are not at a hospital or clinic, you can use: ? An automated blood pressure machine at a pharmacy. ? A home blood pressure monitor.  If you are between 55 years and 79 years old, ask your health care provider if you should take aspirin to prevent strokes.  Have regular diabetes screenings. This involves taking a blood sample to check your fasting blood sugar level. ? If you are at a normal weight and have a low risk for diabetes, have this test once every three years after 68 years of age. ? If you are overweight and have a high risk for diabetes, consider being tested at a younger age or more often. Preventing infection Hepatitis B  If you have a higher risk for hepatitis B, you should be screened for this virus. You are considered at high risk for hepatitis B if: ? You were born in a country where hepatitis B is common. Ask your health care provider which countries are considered high risk. ? Your parents were born in a high-risk country, and you have not been immunized against hepatitis B (hepatitis B vaccine). ? You have HIV or AIDS. ? You use needles to inject street drugs. ? You live with someone who has hepatitis B. ? You have had sex with someone who has hepatitis B. ? You get hemodialysis treatment. ? You take certain medicines for conditions, including cancer, organ transplantation, and autoimmune conditions.  Hepatitis C  Blood  testing is recommended for: ? Everyone born from 1945 through 1965. ? Anyone with known risk factors for hepatitis C.  Sexually transmitted infections (STIs)  You should be screened for sexually transmitted infections (STIs) including gonorrhea and chlamydia if: ? You are sexually active and are younger than 68 years of age. ? You are older than 68 years of age and your health care provider tells you that you are at risk for this   type of infection. ? Your sexual activity has changed since you were last screened and you are at an increased risk for chlamydia or gonorrhea. Ask your health care provider if you are at risk.  If you do not have HIV, but are at risk, it may be recommended that you take a prescription medicine daily to prevent HIV infection. This is called pre-exposure prophylaxis (PrEP). You are considered at risk if: ? You are sexually active and do not regularly use condoms or know the HIV status of your partner(s). ? You take drugs by injection. ? You are sexually active with a partner who has HIV.  Talk with your health care provider about whether you are at high risk of being infected with HIV. If you choose to begin PrEP, you should first be tested for HIV. You should then be tested every 3 months for as long as you are taking PrEP. Pregnancy  If you are premenopausal and you may become pregnant, ask your health care provider about preconception counseling.  If you may become pregnant, take 400 to 800 micrograms (mcg) of folic acid every day.  If you want to prevent pregnancy, talk to your health care provider about birth control (contraception). Osteoporosis and menopause  Osteoporosis is a disease in which the bones lose minerals and strength with aging. This can result in serious bone fractures. Your risk for osteoporosis can be identified using a bone density scan.  If you are 28 years of age or older, or if you are at risk for osteoporosis and fractures, ask your  health care provider if you should be screened.  Ask your health care provider whether you should take a calcium or vitamin D supplement to lower your risk for osteoporosis.  Menopause may have certain physical symptoms and risks.  Hormone replacement therapy may reduce some of these symptoms and risks. Talk to your health care provider about whether hormone replacement therapy is right for you. Follow these instructions at home:  Schedule regular health, dental, and eye exams.  Stay current with your immunizations.  Do not use any tobacco products including cigarettes, chewing tobacco, or electronic cigarettes.  If you are pregnant, do not drink alcohol.  If you are breastfeeding, limit how much and how often you drink alcohol.  Limit alcohol intake to no more than 1 drink per day for nonpregnant women. One drink equals 12 ounces of beer, 5 ounces of wine, or 1 ounces of hard liquor.  Do not use street drugs.  Do not share needles.  Ask your health care provider for help if you need support or information about quitting drugs.  Tell your health care provider if you often feel depressed.  Tell your health care provider if you have ever been abused or do not feel safe at home. This information is not intended to replace advice given to you by your health care provider. Make sure you discuss any questions you have with your health care provider. Document Released: 03/14/2011 Document Revised: 02/04/2016 Document Reviewed: 06/02/2015 [employer]vier Interactive Patient Education  [employer].

## 2018-07-24 NOTE — Progress Notes (Addendum)
Rachel Boone 67 y.o.   Chief Complaint  Patient presents with  . Mass    follow up   . Medication Refill    PROTONIX and NAPROSEN    HISTORY OF PRESENT ILLNESS: This is a 67 y.o. female complaining of lump in the neck getting bigger.  Seen by me 01/13/2017 for same.  Ultrasound showed lipoma.  Report reviewed.  Recommended CT scan of the neck if mass grew. Also has a history of arthritis and GERD.  Requesting medication refills, Naprosyn and Protonix. Also requesting blood work for hepatitis C and cholesterol.   HPI   Prior to Admission medications   Medication Sig Start Date End Date Taking? Authorizing Provider  diphenhydrAMINE (BENADRYL) 25 MG tablet Take 25 mg by mouth at bedtime as needed for sleep.   Yes [provider]  naproxen (NAPROSYN) 500 MG tablet Take 500 mg by mouth 2 (two) times daily with a meal.   Yes [provider]  pantoprazole (PROTONIX) 40 MG tablet Take 40 mg by mouth 2 (two) times daily.   Yes [provider]  hydrOXYzine (ATARAX/VISTARIL) 25 MG tablet Take 1 tablet (25 mg total) by mouth every 8 (eight) hours as needed for itching. Patient not taking: Reported on 07/24/2018 03/23/18   Wardell Honour, MD  loratadine (CLARITIN) 10 MG tablet Take 1 tablet (10 mg total) by mouth daily. Patient not taking: Reported on 07/24/2018 03/23/18   Wardell Honour, MD  predniSONE (DELTASONE) 20 MG tablet Take 1 tablet (20 mg total) by mouth 2 (two) times daily with a meal. For 5 days then one tablet daily for 5 days Patient not taking: Reported on 07/24/2018 03/23/18   Wardell Honour, MD    Allergies  Allergen Reactions  . Other     Anesthesia=Nausea/Vomiting    Patient Active Problem List   Diagnosis Date Noted  . Gastro-esophageal reflux 05/23/2014  . Arthritis 05/23/2014    Past Medical History:  Diagnosis Date  . Allergy    SEASONAL  . Arthritis   . Cataract   . Endocervical polyp   . Family history of anesthesia  complication    Family history of PONV  . GERD (gastroesophageal reflux disease)   . Hammertoe   . Heart murmur   . Hiatal hernia   . Hyperlipidemia   . Mitral regurgitation    patient denied 10/21/13  . PONV (postoperative nausea and vomiting)   . Pupil irregular     Past Surgical History:  Procedure Laterality Date  . ANTERIOR LATERAL LUMBAR FUSION 4 LEVELS  07/19/2012   Procedure: ANTERIOR LATERAL LUMBAR FUSION 4 LEVELS;  Surgeon: Hosie Spangle, MD;  Location: [facility];  Service: Neurosurgery;  Laterality: N/A;  Lumbar one-two, lumbar two-three, lumbar three-four, lumbar four-five, lumbar five-sacral one anterolateral lumbar fusion with Lumbar one-sacral one posterior instrumentation  . CATARACT EXTRACTION     bilateral  . COLONOSCOPY    . EYE SURGERY    . HAMMER TOE SURGERY    . LUMBAR PERCUTANEOUS PEDICLE SCREW 3 LEVEL  07/19/2012   Procedure: LUMBAR PERCUTANEOUS PEDICLE SCREW 3 LEVEL;  Surgeon: Hosie Spangle, MD;  Location: [facility];  Service: Neurosurgery;  Laterality: Bilateral;  Lumbar one-two, lumbar two-three, lumbar three-four, lumbar four-five, lumbar five-sacral one anterolateral lumbar fusion with Lumbar one-sacral one posterior instrumentation  . PUPILLOPLASTY    . SHOULDER ARTHROSCOPY Left 10/22/2013   Procedure: ARTHROSCOPY SHOULDER;  Surgeon: Newt Minion, MD;  Location: [facility]  OR;  Service: Orthopedics;  Laterality: Left;  Left Shoulder Arthroscopy, Debridement, and Decompression, subacromial decompression and distal clavicle resection  . THUMB FUSION     LEFT    Social History   Socioeconomic History  . Marital status: Single    Spouse name: Not on file  . Number of children: Not on file  . Years of education: Not on file  . Highest education level: Not on file  Occupational History  . Occupation: Retired    Comment: [employer]  Social Needs  . Financial resource strain: Not on file  . Food insecurity:    Worry: Not on  file    Inability: Not on file  . Transportation needs:    Medical: Not on file    Non-medical: Not on file  Tobacco Use  . Smoking status: Former Smoker    Last attempt to quit: 09/12/1989    Years since quitting: 28.8  . Smokeless tobacco: Never Used  Substance and Sexual Activity  . Alcohol use: Yes    Comment: social beer  . Drug use: No  . Sexual activity: Not on file  Lifestyle  . Physical activity:    Days per week: Not on file    Minutes per session: Not on file  . Stress: Not on file  Relationships  . Social connections:    Talks on phone: Not on file    Gets together: Not on file    Attends religious service: Not on file    Active member of club or organization: Not on file    Attends meetings of clubs or organizations: Not on file    Relationship status: Not on file  . Intimate partner violence:    Fear of current or ex partner: Not on file    Emotionally abused: Not on file    Physically abused: Not on file    Forced sexual activity: Not on file  Other Topics Concern  . Not on file  Social History Narrative   Daily caffeine use     Family History  Problem Relation Age of Onset  . Lung cancer Mother   . Diabetes type II Mother   . Heart disease Father   . Colon polyps Sister        adenomatous  . Hypertension Brother   . Hypertension Sister   . Diabetes type II Sister   . Colon cancer Neg Hx      Review of Systems  Constitutional: Negative.  Negative for chills and fever.  HENT: Negative.  Negative for congestion and sore throat.   Eyes: Negative for blurred vision and double vision.  Respiratory: Negative.  Negative for cough and shortness of breath.   Cardiovascular: Negative.  Negative for chest pain and palpitations.  Gastrointestinal: Positive for heartburn. Negative for diarrhea, nausea and vomiting.  Genitourinary: Negative.  Negative for dysuria.       Stress urinary incontinence  Musculoskeletal: Positive for joint pain.  Skin:  Negative.   Neurological: Negative.   Endo/Heme/Allergies: Negative.   All other systems reviewed and are negative.   Vitals:   07/24/18 0804  BP: 126/76  Pulse: 72  Resp: 16  Temp: 98.8 F (37.1 C)  SpO2: 98%    Physical Exam  Constitutional: She is oriented to person, place, and time. She appears well-developed and well-nourished.  HENT:  Head: Normocephalic and atraumatic.  Nose: Nose normal.  Mouth/Throat: Oropharynx is clear and moist.  Eyes: Pupils are equal, round, and  reactive to light. Conjunctivae and EOM are normal.  Neck: Normal range of motion. Neck supple. No thyromegaly present.  Very small lipoma in the middle of anterior neck.  Nontender.  No fluctuation.  Cardiovascular: Normal rate and regular rhythm.  Murmur heard. Pulmonary/Chest: Effort normal and breath sounds normal.  Musculoskeletal: Normal range of motion.  Chronic arthritic changes to hands and fingers.  Lymphadenopathy:    She has no cervical adenopathy.  Neurological: She is alert and oriented to person, place, and time. No sensory deficit. She exhibits normal muscle tone. Coordination normal.  Skin: Skin is warm and dry. Capillary refill takes less than 2 seconds.  Psychiatric: She has a normal mood and affect. Her behavior is normal.  Vitals reviewed.    ASSESSMENT & PLAN: Zigmund Daniel was seen today for mass and medication refill.  Diagnoses and all orders for this visit:  Lump in neck -     Ambulatory referral to General Surgery  Localized swelling, mass or lump of neck -     CT Soft Tissue Neck W Contrast; Future -     Ambulatory referral to General Surgery  Gastroesophageal reflux disease without esophagitis -     pantoprazole (PROTONIX) 40 MG tablet; Take 1 tablet (40 mg total) by mouth daily. -     Lipid panel  Arthritis -     naproxen (NAPROSYN) 500 MG tablet; Take 1 tablet (500 mg total) by mouth 2 (two) times daily with a meal. -     Comprehensive metabolic panel  Need for  hepatitis C screening test -     Hepatitis C antibody  SUI (stress urinary incontinence, female) -     Ambulatory referral to Urology    . Patient Instructions       If you have lab work done today you will be contacted with your lab results within the next 2 weeks.  If you have not heard from [place] then please contact us. The fastest way to get your results is to register for My Chart.   IF you received an x-ray today, you will receive an invoice from [facility]. Please contact [facility] at [phone] with questions or concerns regarding your invoice.   IF you received labwork today, you will receive an invoice from [place]. Please contact [facility] at [phone] with questions or concerns regarding your invoice.   Our billing staff will not be able to assist you with questions regarding bills from these companies.  You will be contacted with the lab results as soon as they are available. The fastest way to get your results is to activate your My Chart account. Instructions are located on the last page of this paperwork. If you have not heard from [place] regarding the results in 2 weeks, please contact this office.    Health Maintenance, Female Adopting a healthy lifestyle and getting preventive care can go a long way to promote health and wellness. Talk with your health care provider about what schedule of regular examinations is right for you. This is a good chance for you to check in with your provider about disease prevention and staying healthy. In between checkups, there are plenty of things you can do on your own. Experts have done a lot of research about which lifestyle changes and preventive measures are most likely to keep you healthy. Ask your health care provider for more information. Weight and diet Eat a healthy diet  Be sure to include plenty of vegetables, fruits, low-fat dairy products,  and lean protein.  Do not eat a lot of foods high in solid  fats, added sugars, or salt.  Get regular exercise. This is one of the most important things you can do for your health. ? Most adults should exercise for at least 150 minutes each week. The exercise should increase your heart rate and make you sweat (moderate-intensity exercise). ? Most adults should also do strengthening exercises at least twice a week. This is in addition to the moderate-intensity exercise.  Maintain a healthy weight  Body mass index (BMI) is a measurement that can be used to identify possible weight problems. It estimates body fat based on height and weight. Your health care provider can help determine your BMI and help you achieve or maintain a healthy weight.  For females 45 years of age and older: ? A BMI below 18.5 is considered underweight. ? A BMI of 18.5 to 24.9 is normal. ? A BMI of 25 to 29.9 is considered overweight. ? A BMI of 30 and above is considered obese.  Watch levels of cholesterol and blood lipids  You should start having your blood tested for lipids and cholesterol at 68 years of age, then have this test every 5 years.  You may need to have your cholesterol levels checked more often if: ? Your lipid or cholesterol levels are high. ? You are older than 68 years of age. ? You are at high risk for heart disease.  Cancer screening Lung Cancer  Lung cancer screening is recommended for adults 53-71 years old who are at high risk for lung cancer because of a history of smoking.  A yearly low-dose CT scan of the lungs is recommended for people who: ? Currently smoke. ? Have quit within the past 15 years. ? Have at least a 30-pack-year history of smoking. A pack year is smoking an average of one pack of cigarettes a day for 1 year.  Yearly screening should continue until it has been 15 years since you quit.  Yearly screening should stop if you develop a health problem that would prevent you from having lung cancer treatment.  Breast  Cancer  Practice breast self-awareness. This means understanding how your breasts normally appear and feel.  It also means doing regular breast self-exams. Let your health care provider know about any changes, no matter how small.  If you are in your 20s or 30s, you should have a clinical breast exam (CBE) by a health care provider every 1-3 years as part of a regular health exam.  If you are 75 or older, have a CBE every year. Also consider having a breast X-ray (mammogram) every year.  If you have a family history of breast cancer, talk to your health care provider about genetic screening.  If you are at high risk for breast cancer, talk to your health care provider about having an MRI and a mammogram every year.  Breast cancer gene (BRCA) assessment is recommended for women who have family members with BRCA-related cancers. BRCA-related cancers include: ? Breast. ? Ovarian. ? Tubal. ? Peritoneal cancers.  Results of the assessment will determine the need for genetic counseling and BRCA1 and BRCA2 testing.  Cervical Cancer Your health care provider may recommend that you be screened regularly for cancer of the pelvic organs (ovaries, uterus, and vagina). This screening involves a pelvic examination, including checking for microscopic changes to the surface of your cervix (Pap test). You may be encouraged to have this screening done every  3 years, beginning at age 90.  For women ages 44-65, health care providers may recommend pelvic exams and Pap testing every 3 years, or they may recommend the Pap and pelvic exam, combined with testing for human papilloma virus (HPV), every 5 years. Some types of HPV increase your risk of cervical cancer. Testing for HPV may also be done on women of any age with unclear Pap test results.  Other health care providers may not recommend any screening for nonpregnant women who are considered low risk for pelvic cancer and who do not have symptoms. Ask your  health care provider if a screening pelvic exam is right for you.  If you have had past treatment for cervical cancer or a condition that could lead to cancer, you need Pap tests and screening for cancer for at least 20 years after your treatment. If Pap tests have been discontinued, your risk factors (such as having a new sexual partner) need to be reassessed to determine if screening should resume. Some women have medical problems that increase the chance of getting cervical cancer. In these cases, your health care provider may recommend more frequent screening and Pap tests.  Colorectal Cancer  This type of cancer can be detected and often prevented.  Routine colorectal cancer screening usually begins at 68 years of age and continues through 68 years of age.  Your health care provider may recommend screening at an earlier age if you have risk factors for colon cancer.  Your health care provider may also recommend using home test kits to check for hidden blood in the stool.  A small camera at the end of a tube can be used to examine your colon directly (sigmoidoscopy or colonoscopy). This is done to check for the earliest forms of colorectal cancer.  Routine screening usually begins at age 50.  Direct examination of the colon should be repeated every 5-10 years through 68 years of age. However, you may need to be screened more often if early forms of precancerous polyps or small growths are found.  Skin Cancer  Check your skin from head to toe regularly.  Tell your health care provider about any new moles or changes in moles, especially if there is a change in a mole's shape or color.  Also tell your health care provider if you have a mole that is larger than the size of a pencil eraser.  Always use sunscreen. Apply sunscreen liberally and repeatedly throughout the day.  Protect yourself by wearing long sleeves, pants, a wide-brimmed hat, and sunglasses whenever you are  outside.  Heart disease, diabetes, and high blood pressure  High blood pressure causes heart disease and increases the risk of stroke. High blood pressure is more likely to develop in: ? People who have blood pressure in the high end of the normal range (130-139/85-89 mm Hg). ? People who are overweight or obese. ? People who are African American.  If you are 25-78 years of age, have your blood pressure checked every 3-5 years. If you are 106 years of age or older, have your blood pressure checked every year. You should have your blood pressure measured twice-once when you are at a hospital or clinic, and once when you are not at a hospital or clinic. Record the average of the two measurements. To check your blood pressure when you are not at a hospital or clinic, you can use: ? An automated blood pressure machine at a pharmacy. ? A home blood pressure monitor.  If you are between 53 years and 3 years old, ask your health care provider if you should take aspirin to prevent strokes.  Have regular diabetes screenings. This involves taking a blood sample to check your fasting blood sugar level. ? If you are at a normal weight and have a low risk for diabetes, have this test once every three years after 68 years of age. ? If you are overweight and have a high risk for diabetes, consider being tested at a younger age or more often. Preventing infection Hepatitis B  If you have a higher risk for hepatitis B, you should be screened for this virus. You are considered at high risk for hepatitis B if: ? You were born in a country where hepatitis B is common. Ask your health care provider which countries are considered high risk. ? Your parents were born in a high-risk country, and you have not been immunized against hepatitis B (hepatitis B vaccine). ? You have HIV or AIDS. ? You use needles to inject street drugs. ? You live with someone who has hepatitis B. ? You have had sex with someone who has  hepatitis B. ? You get hemodialysis treatment. ? You take certain medicines for conditions, including cancer, organ transplantation, and autoimmune conditions.  Hepatitis C  Blood testing is recommended for: ? Everyone born from [birth year] through 1966. ? Anyone with known risk factors for hepatitis C.  Sexually transmitted infections (STIs)  You should be screened for sexually transmitted infections (STIs) including gonorrhea and chlamydia if: ? You are sexually active and are younger than 68 years of age. ? You are older than 68 years of age and your health care provider tells you that you are at risk for this type of infection. ? Your sexual activity has changed since you were last screened and you are at an increased risk for chlamydia or gonorrhea. Ask your health care provider if you are at risk.  If you do not have HIV, but are at risk, it may be recommended that you take a prescription medicine daily to prevent HIV infection. This is called pre-exposure prophylaxis (PrEP). You are considered at risk if: ? You are sexually active and do not regularly use condoms or know the HIV status of your partner(s). ? You take drugs by injection. ? You are sexually active with a partner who has HIV.  Talk with your health care provider about whether you are at high risk of being infected with HIV. If you choose to begin PrEP, you should first be tested for HIV. You should then be tested every 3 months for as long as you are taking PrEP. Pregnancy  If you are premenopausal and you may become pregnant, ask your health care provider about preconception counseling.  If you may become pregnant, take 400 to 800 micrograms (mcg) of folic acid every day.  If you want to prevent pregnancy, talk to your health care provider about birth control (contraception). Osteoporosis and menopause  Osteoporosis is a disease in which the bones lose minerals and strength with aging. This can result in serious bone  fractures. Your risk for osteoporosis can be identified using a bone density scan.  If you are 34 years of age or older, or if you are at risk for osteoporosis and fractures, ask your health care provider if you should be screened.  Ask your health care provider whether you should take a calcium or vitamin D supplement to lower your risk for osteoporosis.  Menopause may have certain physical symptoms and risks.  Hormone replacement therapy may reduce some of these symptoms and risks. Talk to your health care provider about whether hormone replacement therapy is right for you. Follow these instructions at home:  Schedule regular health, dental, and eye exams.  Stay current with your immunizations.  Do not use any tobacco products including cigarettes, chewing tobacco, or electronic cigarettes.  If you are pregnant, do not drink alcohol.  If you are breastfeeding, limit how much and how often you drink alcohol.  Limit alcohol intake to no more than 1 drink per day for nonpregnant women. One drink equals 12 ounces of beer, 5 ounces of wine, or 1 ounces of hard liquor.  Do not use street drugs.  Do not share needles.  Ask your health care provider for help if you need support or information about quitting drugs.  Tell your health care provider if you often feel depressed.  Tell your health care provider if you have ever been abused or do not feel safe at home. This information is not intended to replace advice given to you by your health care provider. Make sure you discuss any questions you have with your health care provider. Document Released: 03/14/2011 Document Revised: 02/04/2016 Document Reviewed: 06/02/2015 Elsevier Interactive Patient Education  2018 Elsevier Inc.      Agustina Caroli, MD [facility]

## 2018-07-25 ENCOUNTER — Other Ambulatory Visit: Payer: Self-pay | Admitting: Emergency Medicine

## 2018-07-25 ENCOUNTER — Encounter: Payer: Self-pay | Admitting: Radiology

## 2018-07-25 LAB — COMPREHENSIVE METABOLIC PANEL
A/G RATIO: 1.7 (ref 1.2–2.2)
ALBUMIN: 4.7 g/dL (ref 3.6–4.8)
ALT: 14 IU/L (ref 0–32)
AST: 28 IU/L (ref 0–40)
Alkaline Phosphatase: 66 IU/L (ref 39–117)
BUN/Creatinine Ratio: 17 (ref 12–28)
BUN: 14 mg/dL (ref 8–27)
Bilirubin Total: 0.6 mg/dL (ref 0.0–1.2)
CO2: 23 mmol/L (ref 20–29)
Calcium: 9.6 mg/dL (ref 8.7–10.3)
Chloride: 101 mmol/L (ref 96–106)
Creatinine, Ser: 0.81 mg/dL (ref 0.57–1.00)
GFR calc non Af Amer: 75 mL/min/{1.73_m2} (ref >59)
GFR, EST AFRICAN AMERICAN: 87 mL/min/{1.73_m2} (ref >59)
GLOBULIN, TOTAL: 2.8 g/dL (ref 1.5–4.5)
GLUCOSE: 95 mg/dL (ref 65–99)
POTASSIUM: 4.5 mmol/L (ref 3.5–5.2)
SODIUM: 140 mmol/L (ref 134–144)
Total Protein: 7.5 g/dL (ref 6.0–8.5)

## 2018-07-25 LAB — LIPID PANEL
CHOLESTEROL TOTAL: 295 mg/dL — AB (ref 100–199)
Chol/HDL Ratio: 3 ratio (ref 0.0–4.4)
HDL: 99 mg/dL (ref >39)
LDL Calculated: 186 mg/dL — ABNORMAL HIGH (ref 0–99)
Triglycerides: 52 mg/dL (ref 0–149)
VLDL CHOLESTEROL CAL: 10 mg/dL (ref 5–40)

## 2018-07-25 LAB — HEPATITIS C ANTIBODY

## 2018-07-25 MED ORDER — ROSUVASTATIN CALCIUM 20 MG PO TABS: 20.0000 mg | ORAL_TABLET | Freq: Every day | ORAL | 3 refills | Status: DC

## 2018-07-30 ENCOUNTER — Ambulatory Visit
Admission: RE | Admit: 2018-07-30 | Discharge: 2018-07-30 | Disposition: A | Discharge status: Home / Self Care | Payer: PPO | Source: Ambulatory Visit | Attending: Emergency Medicine | Admitting: Emergency Medicine

## 2018-07-30 ENCOUNTER — Other Ambulatory Visit: Payer: Self-pay | Admitting: Emergency Medicine

## 2018-07-30 DIAGNOSIS — E042 Nontoxic multinodular goiter: Secondary | ICD-10-CM

## 2018-07-30 DIAGNOSIS — R221 Localized swelling, mass and lump, neck: Secondary | ICD-10-CM

## 2018-07-30 DIAGNOSIS — S199XXA Unspecified injury of neck, initial encounter: Secondary | ICD-10-CM | POA: Diagnosis not present

## 2018-07-30 DIAGNOSIS — D179 Benign lipomatous neoplasm, unspecified: Secondary | ICD-10-CM

## 2018-07-30 HISTORY — DX: Nontoxic multinodular goiter: E04.2

## 2018-07-30 HISTORY — DX: Benign lipomatous neoplasm, unspecified: D17.9

## 2018-07-30 MED ORDER — IOPAMIDOL (ISOVUE-300) INJECTION 61%
75.0000 mL | Freq: Once | INTRAVENOUS | Status: AC | PRN
  Administered 2018-07-30: 75 mL via INTRAVENOUS

## 2018-08-01 ENCOUNTER — Encounter: Payer: Self-pay | Admitting: Emergency Medicine

## 2018-08-01 DIAGNOSIS — D1730 Benign lipomatous neoplasm of skin and subcutaneous tissue of unspecified sites: Secondary | ICD-10-CM | POA: Diagnosis not present

## 2018-08-01 NOTE — Progress Notes (Signed)
Lab letter has be sent via mail to patient home address

## 2018-08-14 ENCOUNTER — Ambulatory Visit
Admission: RE | Admit: 2018-08-14 | Discharge: 2018-08-14 | Disposition: A | Discharge status: Home / Self Care | Payer: PPO | Source: Ambulatory Visit | Attending: Emergency Medicine | Admitting: Emergency Medicine

## 2018-08-14 DIAGNOSIS — E041 Nontoxic single thyroid nodule: Secondary | ICD-10-CM | POA: Diagnosis not present

## 2018-08-14 DIAGNOSIS — E042 Nontoxic multinodular goiter: Secondary | ICD-10-CM

## 2018-08-15 ENCOUNTER — Encounter: Payer: Self-pay | Admitting: Emergency Medicine

## 2018-08-29 ENCOUNTER — Telehealth: Payer: Self-pay | Admitting: Emergency Medicine

## 2018-08-29 NOTE — Telephone Encounter (Signed)
Copied from [place] [phone] [phone]. Topic: General - Other >> Aug 29, 2018 11:06 AM Rachel Boone, NT wrote: Reason for CRM: Patient says she is scheduled for a fine needle biopsy on Friday and she would prefer a endocrinologist to do the procedure  instead ,please advise  [phone]

## 2018-08-30 ENCOUNTER — Encounter (HOSPITAL_BASED_OUTPATIENT_CLINIC_OR_DEPARTMENT_OTHER): Payer: Self-pay

## 2018-08-30 NOTE — Telephone Encounter (Signed)
She is scheduled to get the test that she needs tomorrow.  Why delay things?  I can refer her to endocrinologist if she prefers but I know it will be a wait.  I recommend she goes on with the procedure as scheduled.  Thanks.

## 2018-09-03 ENCOUNTER — Encounter (HOSPITAL_BASED_OUTPATIENT_CLINIC_OR_DEPARTMENT_OTHER): Payer: Self-pay

## 2018-09-03 ENCOUNTER — Other Ambulatory Visit: Payer: Self-pay

## 2018-09-03 NOTE — Progress Notes (Signed)
Spoke with: Zigmund Daniel NPO:  No food after midnight/Clear liquids until 7:00 AM DOS Arrival time: 1100 AM Labs: N/A AM medications:  Pantoprazole Pre op orders: No orders requested Ride home:  [place] [phone]

## 2018-09-07 ENCOUNTER — Other Ambulatory Visit: Payer: Self-pay | Admitting: Emergency Medicine

## 2018-09-07 ENCOUNTER — Telehealth: Payer: Self-pay | Admitting: Emergency Medicine

## 2018-09-07 ENCOUNTER — Other Ambulatory Visit: Payer: Self-pay

## 2018-09-07 DIAGNOSIS — E042 Nontoxic multinodular goiter: Secondary | ICD-10-CM

## 2018-09-07 DIAGNOSIS — Z1231 Encounter for screening mammogram for malignant neoplasm of breast: Secondary | ICD-10-CM

## 2018-09-07 NOTE — Telephone Encounter (Signed)
Pt reiterated that she does not want general surgery to perform biopsy of thyroid as she is afraid they will mess it up.  Wants to see specialist.  Expressed that we can refer to endocrinology per Dr. Barry Brunner note but that he was trying to avoid a wait.  Pt aware and prefers endocrinology.  Referral entered.

## 2018-09-07 NOTE — Telephone Encounter (Signed)
Referral sent today.  Thanks.

## 2018-09-07 NOTE — Telephone Encounter (Signed)
Please review message from Today She would like a referral / she has cancelled her surgery   Thank  You      Leward Quan A at 09/07/2018 9:13 AM   Status: Signed    Patient called to get information on seeing a Thyroid specialist. She stated that she will be cancelling the surgery scheduled for 09/14/17. Please call patient back to inform her on the progress of this referral. She stated that she has called several times and no one has responded to her request. Patient say she is a bit annoyed please call back at  Ph# [phone]    Horald Pollen, MD at 08/30/2018 11:36 AM   Status: Signed    She is scheduled to get the test that she needs tomorrow.  Why delay things?  I can refer her to endocrinologist if she prefers but I know it will be a wait.  I recommend she goes on with the procedure as scheduled.  Thanks.    Cecelia Byars, NT at 08/29/2018 11:08 AM   Status: Signed    Copied from [place] [phone]. Topic: General - Other >> Aug 29, 2018 11:06 AM Cecelia Byars, NT wrote: Reason for CRM: Patient says she is scheduled for a fine needle biopsy on Friday and she would prefer a endocrinologist to do the procedure  instead ,please advise  [phone]

## 2018-09-07 NOTE — Telephone Encounter (Signed)
Patient called to get information on seeing a Thyroid specialist. She stated that she will be cancelling the surgery scheduled for 09/14/17. Please call patient back to inform her on the progress of this referral. She stated that she has called several times and no one has responded to her request. Patient say she is a bit annoyed please call back at  Ph# [phone]

## 2018-09-10 ENCOUNTER — Other Ambulatory Visit (INDEPENDENT_AMBULATORY_CARE_PROVIDER_SITE_OTHER): Payer: PPO

## 2018-09-10 ENCOUNTER — Encounter: Payer: Self-pay | Admitting: Internal Medicine

## 2018-09-10 ENCOUNTER — Ambulatory Visit (INDEPENDENT_AMBULATORY_CARE_PROVIDER_SITE_OTHER): Payer: PPO | Admitting: Internal Medicine

## 2018-09-10 VITALS — BP 120/70 | HR 78 | Ht 67.0 in | Wt 156.2 lb

## 2018-09-10 DIAGNOSIS — E042 Nontoxic multinodular goiter: Secondary | ICD-10-CM

## 2018-09-10 LAB — T4, FREE: Free T4: 1.29 ng/dL (ref 0.60–1.60)

## 2018-09-10 LAB — TSH: TSH: 1.28 u[IU]/mL (ref 0.35–4.50)

## 2018-09-10 NOTE — Progress Notes (Signed)
Name: Rachel Boone  MRN/ DOB: [id number], [birth year]    Age/ Sex: 67 y.o., female    PCP: Horald Pollen, MD   Reason for Endocrinology Evaluation: MNG     Date of Initial Endocrinology Evaluation: 09/10/2018     HPI: Ms. Rachel Boone is a 67 y.o. female with unremarkable past medical history . The patient presented for initial endocrinology clinic visit on 09/10/2018 for consultative assistance with her MNG.   Pt noted a localized subcutaneous neck enlargements ~ 2 yrs ago, this was thought to be a lipoma. She has noted an increase in size over the past 2 years. She was scheduled for a resection but once a thyroid revealed a thyroid nodule requiring biopsy, she canceled the surgical appointment doe the lipoma at this time.   She denies any local neck symptoms other then the lipoma. She denies any dysphagia, sob or pain.  No prior exposure to radiation.  Denies any hypo or hyperthyroid symptoms.     HISTORY:  Past Medical History:  Past Medical History:  Diagnosis Date  . Allergy    SEASONAL  . Cataract    Bilateral  . DDD (degenerative disc disease), cervical 04/2001  . Endocervical polyp    pt unaware  . Family history of anesthesia complication    Family history of PONV  . Gastritis 05/27/2011   Noted on EGD  . GERD (gastroesophageal reflux disease)   . Hammertoe    Bilateral  . Heart murmur    since birth  . Hiatal hernia 05/27/2011   Small, Noted on EGD  . History of colon polyps 05/29/2015   Noted on colonscopy, pt unaware  . Hyperlipidemia   . Insomnia   . Lipoma 07/30/2018   1.8cmx1.5cmx31mm, noted on CT neck  . Mitral regurgitation    patient denied 10/21/13  . Multinodular thyroid 07/30/2018   Noted on CT neck  . OA (osteoarthritis)   . PONV (postoperative nausea and vomiting)   . Pupil dilation    Right eye  . Pupil irregular   . Restless leg syndrome   . Wears partial dentures    upper    Past Surgical History:  Past Surgical  History:  Procedure Laterality Date  . ANTERIOR LATERAL LUMBAR FUSION 4 LEVELS  07/19/2012   Procedure: ANTERIOR LATERAL LUMBAR FUSION 4 LEVELS;  Surgeon: Hosie Spangle, MD;  Location: [facility];  Service: Neurosurgery;  Laterality: N/A;  Lumbar one-two, lumbar two-three, lumbar three-four, lumbar four-five, lumbar five-sacral one anterolateral lumbar fusion with Lumbar one-sacral one posterior instrumentation  . CATARACT EXTRACTION     bilateral  . COLONOSCOPY W/ POLYPECTOMY  05/29/2015  . ESOPHAGOGASTRODUODENOSCOPY  05/27/2011  . EYE SURGERY    . HAMMER TOE SURGERY    . LUMBAR PERCUTANEOUS PEDICLE SCREW 3 LEVEL  07/19/2012   Procedure: LUMBAR PERCUTANEOUS PEDICLE SCREW 3 LEVEL;  Surgeon: Hosie Spangle, MD;  Location: [facility];  Service: Neurosurgery;  Laterality: Bilateral;  Lumbar one-two, lumbar two-three, lumbar three-four, lumbar four-five, lumbar five-sacral one anterolateral lumbar fusion with Lumbar one-sacral one posterior instrumentation  . PUPILLOPLASTY Right   . SHOULDER ARTHROSCOPY Left 10/22/2013   Procedure: ARTHROSCOPY SHOULDER;  Surgeon: Newt Minion, MD;  Location: [facility];  Service: Orthopedics;  Laterality: Left;  Left Shoulder Arthroscopy, Debridement, and Decompression, subacromial decompression and distal clavicle resection  . THUMB FUSION     LEFT      Social History:  reports that she quit  smoking about 29 years ago. She has never used smokeless tobacco. She reports current alcohol use. She reports that she does not use drugs.  Family History: family history includes Colon polyps in her sister; Diabetes type II in her mother and sister; Heart disease in her father; Hypertension in her brother and sister; Lung cancer in her mother.   HOME MEDICATIONS: Current Outpatient Medications on File Prior to Visit  Medication Sig Dispense Refill  . diphenhydrAMINE (BENADRYL) 25 MG tablet Take 25 mg by mouth at bedtime as needed for sleep.    . naproxen  (NAPROSYN) 500 MG tablet Take 1 tablet (500 mg total) by mouth 2 (two) times daily with a meal. (Patient taking differently: Take 500 mg by mouth as needed. ) 180 tablet 1  . pantoprazole (PROTONIX) 40 MG tablet Take 1 tablet (40 mg total) by mouth daily. (Patient taking differently: Take 40 mg by mouth as needed. ) 180 tablet 3   No current facility-administered medications on file prior to visit.       REVIEW OF SYSTEMS: A comprehensive ROS was conducted with the patient and is negative except as per HPI and below:  Review of Systems  Constitutional: Positive for malaise/fatigue. Negative for weight loss.  HENT: Negative for congestion and sore throat.   Eyes: Negative for blurred vision and pain.  Respiratory: Negative for cough and shortness of breath.   Cardiovascular: Negative for chest pain and palpitations.  Gastrointestinal: Negative for constipation and nausea.  Genitourinary: Negative for frequency.  Musculoskeletal: Negative for joint pain.  Skin: Negative.   Neurological: Negative for tingling and tremors.  Endo/Heme/Allergies: Negative for polydipsia.  Psychiatric/Behavioral: Negative for depression. The patient is not nervous/anxious.        OBJECTIVE:  VS: BP 120/70 (BP Location: Left Arm, Patient Position: Sitting, Cuff Size: Normal)   Pulse 78   Ht 5\' 7"  (1.702 m)   Wt 156 lb 3.2 oz (70.9 kg)   BMI 24.46 kg/m    Wt Readings from Last 3 Encounters:  09/10/18 156 lb 3.2 oz (70.9 kg)  07/24/18 155 lb (70.3 kg)  03/23/18 150 lb (68 kg)     EXAM: General: Pt appears well and is in NAD  Hydration: Well-hydrated with moist mucous membranes and good skin turgor  Eyes: External eye exam normal without stare, lid lag or exophthalmos.  EOM intact.    Ears, Nose, Throat: Hearing: Grossly intact bilaterally Dental: Good dentition  Throat: Clear without mass, erythema or exudate  Neck: General: Supple without adenopathy. Thyroid: Thyroid size normal.  No goiter or  nodules appreciated. No thyroid bruit.  Lungs: Clear with good BS bilat with no rales, rhonchi, or wheezes  Heart: Auscultation: RRR.  Abdomen: Normoactive bowel sounds, soft, nontender, without masses or organomegaly palpable  Extremities:  BL LE: No pretibial edema normal ROM and strength.  Skin: Hair: Texture and amount normal with gender appropriate distribution Skin Inspection: No rashes. Skin Palpation: Skin temperature, texture, and thickness normal to palpation  Neuro: Cranial nerves: II - XII grossly intact  Motor: Normal strength throughout DTRs: 2+ and symmetric in UE without delay in relaxation phase  Mental Status: Judgment, insight: Intact Orientation: Oriented to time, place, and person Mood and affect: No depression, anxiety, or agitation     DATA REVIEWED: Results for SHAILYNN, FONG (MRN [id number]) as of 09/10/2018 14:06  Ref. Range 05/04/2015 10:08 01/13/2017 12:16  TSH Latest Ref Range: 0.450 - 4.500 uIU/mL 0.987 1.080   Thyroid Ultrasound 08/14/2018  Parenchymal Echotexture: Moderately heterogenous  Isthmus: 0.3 cm  Right lobe: 5.6 cm x 2.6 cm x 1.9 cm  Left lobe: 3.7 cm x 1.6 cm x 1.6 cm  _________________________________________________________  Estimated total number of nodules >/= 1 cm: 3  Number of spongiform nodules >/=  2 cm not described below (TR1): 0  Number of mixed cystic and solid nodules >/= 1.5 cm not described below (TR2): 0  _________________________________________________________  Nodule # 1:  Location: Right; Superior  Maximum size: 0.7 cm; Other 2 dimensions: 0.5 cm x 0.3 cm  Composition: spongiform (0)  ACR TI-RADS recommendations:  Spongiform nodule does not meet criteria for surveillance or biopsy  _________________________________________________________  Nodule # 2:  Location: Right; Mid  Maximum size: 0.8 cm; Other 2 dimensions: 0.8 cm x 0.7 cm  Composition: spongiform (0)  ACR TI-RADS  recommendations:  Spongiform nodule does not meet criteria for surveillance or biopsy  _________________________________________________________  Nodule # 3:  Location: Right; Inferior  Maximum size: 2.4 cm; Other 2 dimensions: 1.9 cm x 1.8 cm  Composition: solid/almost completely solid (2)  Echogenicity: isoechoic (1)  Shape: taller-than-wide (3)  Margins: ill-defined (0)  Echogenic foci: none (0)  ACR TI-RADS total points: 6.  ACR TI-RADS risk category: TR4 (4-6 points).  ACR TI-RADS recommendations:  Nodule meets criteria for biopsy  _________________________________________________________  Nodule # 4:  Location: Left; Superior  Maximum size: 1.6 cm; Other 2 dimensions: 1.1 cm x 0.7 cm  Composition: mixed cystic and solid (1)  Echogenicity: isoechoic (1)  Shape: not taller-than-wide (0)  Margins: ill-defined (0)  Echogenic foci: none (0)  ACR TI-RADS total points: 2.  ACR TI-RADS risk category: TR2 (2 points).  ACR TI-RADS recommendations:  Nodule does not meet criteria for surveillance or biopsy  _________________________________________________________  Nodule # 5:  Location: Left; Mid  Maximum size: 1.0 cm; Other 2 dimensions: 0.7 cm x 0.8 cm  Composition: spongiform (0)  ACR TI-RADS recommendations:  Spongiform nodule does not meet criteria for surveillance or biopsy  _________________________________________________________  Nodule # 6:  Location: Left; Inferior  Maximum size: 0.9 cm; Other 2 dimensions: 0.9 cm x 0.5 cm  Composition: spongiform (0)  ACR TI-RADS recommendations:  Spongiform nodule does not meet criteria for surveillance or biopsy  Nodule # 7:  Location: Left; Inferior  Maximum size: 0.9 cm; Other 2 dimensions: 0.9 cm x 0.7 cm  Composition: spongiform (0)  ACR TI-RADS recommendations:  Spongiform nodule does not meet criteria for surveillance or  biopsy  _________________________________________________________  No adenopathy  IMPRESSION: Right inferior, exophytic thyroid nodule (labeled 3) meets criteria for biopsy, as designated by the newly established ACR TI-RADS criteria, and referral for biopsy is recommended.   Results for LYNSAY, FESPERMAN (MRN [id number]) as of 09/11/2018 16:09  Ref. Range 09/10/2018 14:49  TSH Latest Ref Range: 0.35 - 4.50 uIU/mL 1.28  T4,Free(Direct) Latest Ref Range: 0.60 - 1.60 ng/dL 1.29     Old records , labs and images have been reviewed.    ASSESSMENT/PLAN/RECOMMENDATIONS:   1. Multinodular Goiter   - She is clinically and bio-chemically euthyroid  - Denies any local neck symptoms  - Pt agreed to proceed with FNA of the right inferior nodule, which has a moderate risk of malignancy per at ACR TI-RADS criteria at 9.1 %.  - Pt will need a repeat ultrasound ~ 6 months following FNA Bx to confirm stability.   F/u in 6 months    Signed electronically by: Mack Guise, MD  [facility]  [facility] [address] [place], [place] [address] Phone: [phone] FAX: [phone]   CC: Horald Pollen, MD [place] [address] Phone: [phone] Fax: [phone]   Return to Endocrinology clinic as below: Future Appointments  Date Time Provider [place]  09/10/2018  2:20 PM Odell Choung, Melanie Crazier, MD [facility] None  09/11/2018 12:40 PM [facility] MM 2 [facility]

## 2018-09-10 NOTE — Patient Instructions (Signed)
We will set you up for a right thyroid nodule needle biopsy.

## 2018-09-11 ENCOUNTER — Ambulatory Visit
Admission: RE | Admit: 2018-09-11 | Discharge: 2018-09-11 | Disposition: A | Discharge status: Home / Self Care | Payer: PPO | Source: Ambulatory Visit | Attending: Emergency Medicine | Admitting: Emergency Medicine

## 2018-09-11 DIAGNOSIS — Z1231 Encounter for screening mammogram for malignant neoplasm of breast: Secondary | ICD-10-CM | POA: Diagnosis not present

## 2018-09-14 ENCOUNTER — Ambulatory Visit (HOSPITAL_BASED_OUTPATIENT_CLINIC_OR_DEPARTMENT_OTHER): Admission: RE | Admit: 2018-09-14 | Payer: PPO | Source: Home / Self Care | Admitting: General Surgery

## 2018-09-14 HISTORY — DX: Presence of dental prosthetic device (complete) (partial): Z97.2

## 2018-09-14 HISTORY — DX: Restless legs syndrome: G25.81

## 2018-09-14 HISTORY — DX: Gastritis, unspecified, without bleeding: K29.70

## 2018-09-14 HISTORY — DX: Personal history of colonic polyps: Z86.010

## 2018-09-14 HISTORY — DX: Insomnia, unspecified: G47.00

## 2018-09-14 HISTORY — DX: Benign lipomatous neoplasm, unspecified: D17.9

## 2018-09-14 HISTORY — DX: Unspecified osteoarthritis, unspecified site: M19.90

## 2018-09-14 HISTORY — DX: Other cervical disc degeneration, unspecified cervical region: M50.30

## 2018-09-14 HISTORY — DX: Nontoxic multinodular goiter: E04.2

## 2018-09-14 HISTORY — DX: Mydriasis: H57.04

## 2018-09-14 SURGERY — EXCISION LIPOMA
Anesthesia: Choice

## 2018-09-26 ENCOUNTER — Other Ambulatory Visit (HOSPITAL_COMMUNITY)
Admission: RE | Admit: 2018-09-26 | Discharge: 2018-09-26 | Disposition: A | Discharge status: Home / Self Care | Payer: PPO | Source: Ambulatory Visit | Attending: Radiology | Admitting: Radiology

## 2018-09-26 ENCOUNTER — Encounter: Payer: Self-pay | Admitting: Internal Medicine

## 2018-09-26 ENCOUNTER — Ambulatory Visit
Admission: RE | Admit: 2018-09-26 | Discharge: 2018-09-26 | Disposition: A | Discharge status: Home / Self Care | Payer: PPO | Source: Ambulatory Visit | Attending: Internal Medicine | Admitting: Internal Medicine

## 2018-09-26 DIAGNOSIS — E042 Nontoxic multinodular goiter: Secondary | ICD-10-CM | POA: Insufficient documentation

## 2018-09-26 DIAGNOSIS — E041 Nontoxic single thyroid nodule: Secondary | ICD-10-CM | POA: Diagnosis not present

## 2018-10-24 ENCOUNTER — Ambulatory Visit: Payer: Self-pay | Admitting: General Surgery

## 2018-11-02 ENCOUNTER — Telehealth: Payer: Self-pay | Admitting: Internal Medicine

## 2018-11-02 ENCOUNTER — Encounter: Payer: Self-pay | Admitting: Internal Medicine

## 2018-11-02 NOTE — Telephone Encounter (Signed)
Attempted to call the patient. No answer. Left her a message to check the portal.     FNA results of the right lower pole nodule was benign (Bethesda category II)    Rachel Boone Smita Lesh

## 2018-11-12 ENCOUNTER — Encounter (HOSPITAL_BASED_OUTPATIENT_CLINIC_OR_DEPARTMENT_OTHER): Payer: Self-pay | Admitting: *Deleted

## 2018-11-12 ENCOUNTER — Other Ambulatory Visit: Payer: Self-pay

## 2018-11-12 NOTE — Progress Notes (Signed)
Spoke with Rachel Boone after midnight food, clear liquids until 530 am and drink pre surgery drink at 530 am then Boone Patient to pick up pre surgery shake 1000 am 11-13-18 at [place] friend Has surgery orders in epic No labs needed

## 2018-11-13 ENCOUNTER — Encounter (HOSPITAL_BASED_OUTPATIENT_CLINIC_OR_DEPARTMENT_OTHER): Payer: Self-pay | Admitting: Anesthesiology

## 2018-11-13 NOTE — Anesthesia Preprocedure Evaluation (Addendum)
Anesthesia Evaluation  Patient identified by MRN, date of birth, ID band Patient awake    Reviewed: Allergy & Precautions, NPO status , Patient's Chart, lab work & pertinent test results  History of Anesthesia Complications (+) PONV  Airway Mallampati: I  TM Distance: >3 FB Neck ROM: Full    Dental  (+) Teeth Intact, Dental Advisory Given   Pulmonary former smoker,    breath sounds clear to auscultation       Cardiovascular  Rhythm:Regular Rate:Normal     Neuro/Psych negative neurological ROS     GI/Hepatic Neg liver ROS, hiatal hernia, GERD  ,  Endo/Other    Renal/GU negative Renal ROS     Musculoskeletal  (+) Arthritis , Osteoarthritis,    Abdominal Normal abdominal exam  (+)   Peds  Hematology   Anesthesia Other Findings - HLD  Reproductive/Obstetrics                            Anesthesia Physical Anesthesia Plan  ASA: II  Anesthesia Plan: MAC   Post-op Pain Management:    Induction: Intravenous  PONV Risk Score and Plan: 4 or greater and Ondansetron, Dexamethasone, Midazolam, Scopolamine patch - Pre-op and Propofol infusion  Airway Management Planned: Natural Airway and Nasal Cannula  Additional Equipment: None  Intra-op Plan:   Post-operative Plan:   Informed Consent: I have reviewed the patients History and Physical, chart, labs and discussed the procedure including the risks, benefits and alternatives for the proposed anesthesia with the patient or authorized representative who has indicated his/her understanding and acceptance.     Dental advisory given  Plan Discussed with: CRNA  Anesthesia Plan Comments:       Anesthesia Quick Evaluation

## 2018-11-14 ENCOUNTER — Other Ambulatory Visit: Payer: Self-pay

## 2018-11-14 ENCOUNTER — Encounter (HOSPITAL_BASED_OUTPATIENT_CLINIC_OR_DEPARTMENT_OTHER): Payer: Self-pay | Admitting: *Deleted

## 2018-11-14 ENCOUNTER — Ambulatory Visit (HOSPITAL_BASED_OUTPATIENT_CLINIC_OR_DEPARTMENT_OTHER)
Admission: RE | Admit: 2018-11-14 | Discharge: 2018-11-14 | Disposition: A | Discharge status: Home / Self Care | Payer: PPO | Attending: General Surgery | Admitting: General Surgery

## 2018-11-14 ENCOUNTER — Encounter (HOSPITAL_BASED_OUTPATIENT_CLINIC_OR_DEPARTMENT_OTHER)
Admission: RE | Disposition: A | Discharge status: Home / Self Care | Payer: Self-pay | Source: Home / Self Care | Attending: General Surgery

## 2018-11-14 ENCOUNTER — Ambulatory Visit (HOSPITAL_BASED_OUTPATIENT_CLINIC_OR_DEPARTMENT_OTHER): Payer: PPO | Admitting: Anesthesiology

## 2018-11-14 DIAGNOSIS — R221 Localized swelling, mass and lump, neck: Secondary | ICD-10-CM | POA: Insufficient documentation

## 2018-11-14 DIAGNOSIS — K219 Gastro-esophageal reflux disease without esophagitis: Secondary | ICD-10-CM | POA: Insufficient documentation

## 2018-11-14 DIAGNOSIS — D170 Benign lipomatous neoplasm of skin and subcutaneous tissue of head, face and neck: Secondary | ICD-10-CM | POA: Diagnosis not present

## 2018-11-14 DIAGNOSIS — Z87891 Personal history of nicotine dependence: Secondary | ICD-10-CM | POA: Insufficient documentation

## 2018-11-14 DIAGNOSIS — Z79899 Other long term (current) drug therapy: Secondary | ICD-10-CM | POA: Diagnosis not present

## 2018-11-14 HISTORY — PX: LIPOMA EXCISION: SHX5283

## 2018-11-14 SURGERY — EXCISION LIPOMA
Anesthesia: Monitor Anesthesia Care | Site: Neck

## 2018-11-14 MED ORDER — PROPOFOL 500 MG/50ML IV EMUL
INTRAVENOUS | Status: DC | PRN
  Administered 2018-11-14: 100 ug/kg/min via INTRAVENOUS

## 2018-11-14 MED ORDER — FENTANYL CITRATE (PF) 100 MCG/2ML IJ SOLN
INTRAMUSCULAR | Status: DC | PRN
  Administered 2018-11-14: 25 ug via INTRAVENOUS

## 2018-11-14 MED ORDER — CHLORHEXIDINE GLUCONATE CLOTH 2 % EX PADS
6.0000 | MEDICATED_PAD | Freq: Once | CUTANEOUS | Status: DC
  Filled 2018-11-14: qty 6

## 2018-11-14 MED ORDER — SCOPOLAMINE 1 MG/3DAYS TD PT72
1.0000 | MEDICATED_PATCH | TRANSDERMAL | Status: DC
  Administered 2018-11-14: 1.5 mg via TRANSDERMAL
  Filled 2018-11-14: qty 1

## 2018-11-14 MED ORDER — KETOROLAC TROMETHAMINE 30 MG/ML IJ SOLN
INTRAMUSCULAR | Status: AC
  Filled 2018-11-14: qty 1

## 2018-11-14 MED ORDER — MIDAZOLAM HCL 2 MG/2ML IJ SOLN
INTRAMUSCULAR | Status: AC
  Filled 2018-11-14: qty 2

## 2018-11-14 MED ORDER — LIDOCAINE HCL (CARDIAC) PF 100 MG/5ML IV SOSY
PREFILLED_SYRINGE | INTRAVENOUS | Status: DC | PRN
  Administered 2018-11-14: 60 mg via INTRAVENOUS

## 2018-11-14 MED ORDER — PROPOFOL 10 MG/ML IV BOLUS
INTRAVENOUS | Status: DC | PRN
  Administered 2018-11-14 (×2): 20 mg via INTRAVENOUS
  Administered 2018-11-14: 30 mg via INTRAVENOUS

## 2018-11-14 MED ORDER — KETOROLAC TROMETHAMINE 15 MG/ML IJ SOLN
15.0000 mg | INTRAMUSCULAR | Status: AC
  Administered 2018-11-14: 15 mg via INTRAVENOUS
  Filled 2018-11-14: qty 1

## 2018-11-14 MED ORDER — DEXAMETHASONE SODIUM PHOSPHATE 4 MG/ML IJ SOLN
INTRAMUSCULAR | Status: DC | PRN
  Administered 2018-11-14: 8 mg via INTRAVENOUS

## 2018-11-14 MED ORDER — CEFAZOLIN SODIUM-DEXTROSE 2-4 GM/100ML-% IV SOLN
INTRAVENOUS | Status: AC
  Filled 2018-11-14: qty 100

## 2018-11-14 MED ORDER — GABAPENTIN 300 MG PO CAPS
ORAL_CAPSULE | ORAL | Status: AC
  Filled 2018-11-14: qty 1

## 2018-11-14 MED ORDER — IBUPROFEN 800 MG PO TABS: 800.0000 mg | ORAL_TABLET | Freq: Three times a day (TID) | ORAL | 0 refills | Status: DC | PRN

## 2018-11-14 MED ORDER — SCOPOLAMINE 1 MG/3DAYS TD PT72
MEDICATED_PATCH | TRANSDERMAL | Status: AC
  Filled 2018-11-14: qty 1

## 2018-11-14 MED ORDER — CEFAZOLIN SODIUM-DEXTROSE 2-4 GM/100ML-% IV SOLN
2.0000 g | INTRAVENOUS | Status: AC
  Administered 2018-11-14: 2 g via INTRAVENOUS
  Filled 2018-11-14: qty 100

## 2018-11-14 MED ORDER — ACETAMINOPHEN 500 MG PO TABS
ORAL_TABLET | ORAL | Status: AC
  Filled 2018-11-14: qty 2

## 2018-11-14 MED ORDER — DEXAMETHASONE SODIUM PHOSPHATE 10 MG/ML IJ SOLN
INTRAMUSCULAR | Status: AC
  Filled 2018-11-14: qty 1

## 2018-11-14 MED ORDER — ONDANSETRON HCL 4 MG/2ML IJ SOLN
INTRAMUSCULAR | Status: DC | PRN
  Administered 2018-11-14: 4 mg via INTRAVENOUS

## 2018-11-14 MED ORDER — GABAPENTIN 300 MG PO CAPS
300.0000 mg | ORAL_CAPSULE | ORAL | Status: AC
  Administered 2018-11-14: 300 mg via ORAL
  Filled 2018-11-14: qty 1

## 2018-11-14 MED ORDER — BUPIVACAINE-EPINEPHRINE 0.5% -1:200000 IJ SOLN
INTRAMUSCULAR | Status: DC | PRN
  Administered 2018-11-14: 20 mL

## 2018-11-14 MED ORDER — ACETAMINOPHEN 500 MG PO TABS
1000.0000 mg | ORAL_TABLET | ORAL | Status: AC
  Administered 2018-11-14: 1000 mg via ORAL
  Filled 2018-11-14: qty 2

## 2018-11-14 MED ORDER — MIDAZOLAM HCL 5 MG/5ML IJ SOLN
INTRAMUSCULAR | Status: DC | PRN
  Administered 2018-11-14: 2 mg via INTRAVENOUS

## 2018-11-14 MED ORDER — ENSURE PRE-SURGERY PO LIQD
296.0000 mL | Freq: Once | ORAL | Status: DC
  Filled 2018-11-14: qty 296

## 2018-11-14 MED ORDER — PROPOFOL 10 MG/ML IV BOLUS
INTRAVENOUS | Status: AC
  Filled 2018-11-14: qty 20

## 2018-11-14 MED ORDER — PROPOFOL 500 MG/50ML IV EMUL
INTRAVENOUS | Status: AC
  Filled 2018-11-14: qty 50

## 2018-11-14 MED ORDER — LACTATED RINGERS IV SOLN
INTRAVENOUS | Status: DC
  Administered 2018-11-14: 07:00:00 via INTRAVENOUS
  Filled 2018-11-14: qty 1000

## 2018-11-14 MED ORDER — ONDANSETRON HCL 4 MG/2ML IJ SOLN
INTRAMUSCULAR | Status: AC
  Filled 2018-11-14: qty 2

## 2018-11-14 MED ORDER — FENTANYL CITRATE (PF) 100 MCG/2ML IJ SOLN
INTRAMUSCULAR | Status: AC
  Filled 2018-11-14: qty 2

## 2018-11-14 SURGICAL SUPPLY — 54 items
ADH SKN CLS APL DERMABOND .7 (GAUZE/BANDAGES/DRESSINGS) ×2
APL SKNCLS STERI-STRIP NONHPOA (GAUZE/BANDAGES/DRESSINGS)
BENZOIN TINCTURE PRP APPL 2/3 (GAUZE/BANDAGES/DRESSINGS) IMPLANT
BLADE CLIPPER SENSICLIP SURGIC (BLADE) IMPLANT
BLADE HEX COATED 2.75 (ELECTRODE) ×3 IMPLANT
BLADE SURG 15 STRL LF DISP TIS (BLADE) ×1 IMPLANT
BLADE SURG 15 STRL SS (BLADE) ×3
BNDG GAUZE ELAST 4 BULKY (GAUZE/BANDAGES/DRESSINGS) IMPLANT
CHLORAPREP W/TINT 26ML (MISCELLANEOUS) ×3 IMPLANT
CLOSURE WOUND 1/2 X4 (GAUZE/BANDAGES/DRESSINGS)
COVER BACK TABLE 60X90IN (DRAPES) ×3 IMPLANT
COVER MAYO STAND STRL (DRAPES) ×3 IMPLANT
COVER WAND RF STERILE (DRAPES) ×4 IMPLANT
DECANTER SPIKE VIAL GLASS SM (MISCELLANEOUS) IMPLANT
DERMABOND ADVANCED (GAUZE/BANDAGES/DRESSINGS) ×4
DERMABOND ADVANCED .7 DNX12 (GAUZE/BANDAGES/DRESSINGS) IMPLANT
DRAIN PENROSE 18X1/2 LTX STRL (DRAIN) IMPLANT
DRAIN PENROSE 18X1/4 LTX STRL (WOUND CARE) IMPLANT
DRAPE LAPAROTOMY 100X72 PEDS (DRAPES) ×3 IMPLANT
DRAPE UTILITY XL STRL (DRAPES) ×3 IMPLANT
DRSG TEGADERM 4X4.75 (GAUZE/BANDAGES/DRESSINGS) IMPLANT
ELECT REM PT RETURN 9FT ADLT (ELECTROSURGICAL) ×3
ELECTRODE REM PT RTRN 9FT ADLT (ELECTROSURGICAL) ×1 IMPLANT
GAUZE SPONGE 4X4 12PLY STRL (GAUZE/BANDAGES/DRESSINGS) IMPLANT
GLOVE BIOGEL PI IND STRL 7.0 (GLOVE) ×1 IMPLANT
GLOVE BIOGEL PI INDICATOR 7.0 (GLOVE) ×2
GLOVE SURG SS PI 7.0 STRL IVOR (GLOVE) ×3 IMPLANT
GOWN STRL REUS W/TWL LRG LVL3 (GOWN DISPOSABLE) ×3 IMPLANT
GOWN STRL REUS W/TWL XL LVL3 (GOWN DISPOSABLE) ×3 IMPLANT
KIT TURNOVER CYSTO (KITS) ×3 IMPLANT
NDL HYPO 25X1 1.5 SAFETY (NEEDLE) ×1 IMPLANT
NEEDLE HYPO 25X1 1.5 SAFETY (NEEDLE) ×3 IMPLANT
NS IRRIG 500ML POUR BTL (IV SOLUTION) ×2 IMPLANT
PACK BASIN DAY SURGERY FS (CUSTOM PROCEDURE TRAY) ×3 IMPLANT
PENCIL BUTTON HOLSTER BLD 10FT (ELECTRODE) ×3 IMPLANT
SPONGE LAP 4X18 RFD (DISPOSABLE) IMPLANT
STRIP CLOSURE SKIN 1/2X4 (GAUZE/BANDAGES/DRESSINGS) IMPLANT
SUCTION FRAZIER TIP 10 FR DISP (SUCTIONS) IMPLANT
SUT MNCRL AB 4-0 PS2 18 (SUTURE) ×2 IMPLANT
SUT SILK 3 0 TIES 17X18 (SUTURE)
SUT SILK 3-0 18XBRD TIE BLK (SUTURE) IMPLANT
SUT VIC AB 2-0 SH 27 (SUTURE)
SUT VIC AB 2-0 SH 27XBRD (SUTURE) IMPLANT
SUT VIC AB 3-0 SH 27 (SUTURE) ×3
SUT VIC AB 3-0 SH 27X BRD (SUTURE) IMPLANT
SUT VIC AB 3-0 SH 8-18 (SUTURE) IMPLANT
SWAB COLLECTION DEVICE MRSA (MISCELLANEOUS) IMPLANT
SWAB CULTURE ESWAB REG 1ML (MISCELLANEOUS) IMPLANT
SYR BULB IRRIGATION 50ML (SYRINGE) ×2 IMPLANT
SYR CONTROL 10ML LL (SYRINGE) ×3 IMPLANT
TOWEL OR 17X26 10 PK STRL BLUE (TOWEL DISPOSABLE) ×3 IMPLANT
TUBE CONNECTING 12'X1/4 (SUCTIONS) ×1
TUBE CONNECTING 12X1/4 (SUCTIONS) ×2 IMPLANT
YANKAUER SUCT BULB TIP NO VENT (SUCTIONS) ×2 IMPLANT

## 2018-11-14 NOTE — Discharge Instructions (Signed)

## 2018-11-14 NOTE — Anesthesia Procedure Notes (Signed)
Procedure Name: MAC Date/Time: 11/14/2018 8:37 AM Performed by: Effie Berkshire, MD Pre-anesthesia Checklist: Patient identified, Emergency Drugs available, Suction available and Patient being monitored Oxygen Delivery Method: Nasal cannula Placement Confirmation: positive ETCO2 and breath sounds checked- equal and bilateral

## 2018-11-14 NOTE — Anesthesia Postprocedure Evaluation (Signed)
Anesthesia Post Note  Patient: Rachel Boone  Procedure(s) Performed: EXCISION OF NECK LIPOMA (N/A Neck)     Patient location during evaluation: PACU Anesthesia Type: MAC Level of consciousness: awake and alert Pain management: pain level controlled Vital Signs Assessment: post-procedure vital signs reviewed and stable Respiratory status: spontaneous breathing, nonlabored ventilation, respiratory function stable and patient connected to nasal cannula oxygen Cardiovascular status: stable and blood pressure returned to baseline Postop Assessment: no apparent nausea or vomiting Anesthetic complications: no    Last Vitals:  Vitals:   11/14/18 1000 11/14/18 1032  BP: 129/80 121/72  Pulse: (!) 58 61  Resp: 14 16  Temp: 36.4 C 36.8 C  SpO2: 98% 97%    Last Pain:  Vitals:   11/14/18 1000  TempSrc:   PainSc: 0-No pain                 Effie Berkshire

## 2018-11-14 NOTE — Transfer of Care (Signed)
   Last Vitals:  Vitals Value Taken Time  BP 112/68 11/14/2018  9:20 AM  Temp 36.4 C 11/14/2018  9:20 AM  Pulse 72 11/14/2018  9:22 AM  Resp 15 11/14/2018  9:22 AM  SpO2 98 % 11/14/2018  9:22 AM  Vitals shown include unvalidated device data.  Last Pain:  Vitals:   11/14/18 0645  TempSrc:   PainSc: 0-No pain      Patients Stated Pain Goal: 8 (11/14/18 0645)  Immediate Anesthesia Transfer of Care Note  Patient: Rachel Boone  Procedure(s) Performed: Procedure(s) (LRB): EXCISION OF NECK LIPOMA (N/A)  Patient Location: PACU  Anesthesia Type: General  Level of Consciousness: awake, alert  and oriented  Airway & Oxygen Therapy: Patient Spontanous Breathing and Patient connected to face mask oxygen  Post-op Assessment: Report given to PACU RN and Post -op Vital signs reviewed and stable  Post vital signs: Reviewed and stable  Complications: No apparent anesthesia complications

## 2018-11-14 NOTE — H&P (Signed)
Rachel Boone is an 68 y.o. female.   Chief Complaint: neck mass HPI: 68 yo female with superficial enlarging neck mass. The is uncomfortable and she does not like the appearance.  Past Medical History:  Diagnosis Date  . Allergy    SEASONAL  . Cataract    Bilateral  . DDD (degenerative disc disease), cervical 04/2001  . Endocervical polyp    pt unaware  . Family history of anesthesia complication    Family history of PONV  . Gastritis 05/27/2011   Noted on EGD  . GERD (gastroesophageal reflux disease)   . Hammertoe    Bilateral  . Heart murmur    since birth, 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  . Hiatal hernia 05/27/2011   Small, Noted on EGD  . History of colon polyps 05/29/2015   Noted on colonscopy, pt unaware  . Hyperlipidemia   . Insomnia   . Lipoma 07/30/2018   1.8cmx1.5cmx2mm, noted on CT neck  . Mitral regurgitation    patient denied 10/21/13  . Multinodular thyroid 07/30/2018   Noted on CT neck  . OA (osteoarthritis)   . PONV (postoperative nausea and vomiting)    takes meds thry iv to prevent nausea  . Pupil dilation    Right eye  . Pupil irregular    right eye legally blind right eye  . Restless leg syndrome   . Wears partial dentures    upper    Past Surgical History:  Procedure Laterality Date  . ANTERIOR LATERAL LUMBAR FUSION 4 LEVELS  07/19/2012   Procedure: ANTERIOR LATERAL LUMBAR FUSION 4 LEVELS;  Surgeon: Hosie Spangle, MD;  Location: [place] [facility];  Service: Neurosurgery;  Laterality: N/A;  Lumbar one-two, lumbar two-three, lumbar three-four, lumbar four-five, lumbar five-sacral one anterolateral lumbar fusion with Lumbar one-sacral one posterior instrumentation  . CATARACT EXTRACTION     bilateral  . colonscopy  8 yrs ago  . ESOPHAGOGASTRODUODENOSCOPY  05/27/2011  . EYE SURGERY    . HAMMER TOE SURGERY Bilateral   . LUMBAR PERCUTANEOUS PEDICLE SCREW 3 LEVEL  07/19/2012   Procedure: LUMBAR PERCUTANEOUS PEDICLE SCREW 3 LEVEL;  Surgeon: Hosie Spangle, MD;  Location: [place] [facility];  Service: Neurosurgery;  Laterality: Bilateral;  Lumbar one-two, lumbar two-three, lumbar three-four, lumbar four-five, lumbar five-sacral one anterolateral lumbar fusion with Lumbar one-sacral one posterior instrumentation  . PUPILLOPLASTY Right   . SHOULDER ARTHROSCOPY Left 10/22/2013   Procedure: ARTHROSCOPY SHOULDER;  Surgeon: Newt Minion, MD;  Location: [facility];  Service: Orthopedics;  Laterality: Left;  Left Shoulder Arthroscopy, Debridement, and Decompression, subacromial decompression and distal clavicle resection  . THUMB FUSION     LEFT    Family History  Problem Relation Age of Onset  . Lung cancer Mother   . Diabetes type II Mother   . Heart disease Father   . Colon polyps Sister        adenomatous  . Hypertension Brother   . Hypertension Sister   . Diabetes type II Sister   . Colon cancer Neg Hx   . Breast cancer Neg Hx    Social History:  reports that she quit smoking about 29 years ago. She has never used smokeless tobacco. She reports current alcohol use. She reports that she does not use drugs.  Allergies:  Allergies  Allergen Reactions  . Other     Medications Prior to Admission  Medication Sig Dispense Refill  . diphenhydrAMINE (BENADRYL) 50 MG capsule Take 50 mg by mouth at  bedtime.    . pantoprazole (PROTONIX) 40 MG tablet Take 1 tablet (40 mg total) by mouth daily. (Patient taking differently: Take 40 mg by mouth as needed. ) 180 tablet 3    No results found for this or any previous visit (from the past 48 hour(s)). No results found.  Review of Systems  Constitutional: Negative for chills and fever.  HENT: Negative for hearing loss.   Eyes: Negative for blurred vision and double vision.  Respiratory: Negative for cough and hemoptysis.   Cardiovascular: Negative for chest pain and palpitations.  Gastrointestinal: Negative for abdominal pain, nausea and vomiting.  Genitourinary: Negative for dysuria and urgency.   Musculoskeletal: Negative for myalgias and neck pain.  Skin: Negative for itching and rash.  Neurological: Negative for dizziness, tingling and headaches.  Endo/Heme/Allergies: Does not bruise/bleed easily.  Psychiatric/Behavioral: Negative for depression and suicidal ideas.    Blood pressure (!) 132/92, pulse 62, temperature 97.8 F (36.6 C), temperature source Oral, resp. rate 16, height 5\' 7"  (1.702 m), weight 72.7 kg, SpO2 98 %. Physical Exam  Vitals reviewed. Constitutional: She is oriented to person, place, and time. She appears well-developed and well-nourished.  HENT:  Head: Normocephalic and atraumatic.  Eyes: Pupils are equal, round, and reactive to light. Conjunctivae and EOM are normal.  Neck: Normal range of motion. Neck supple.  Superficial mobile upper midline neck mass  Cardiovascular: Normal rate and regular rhythm.  Respiratory: Effort normal and breath sounds normal.  GI: Soft. Bowel sounds are normal. She exhibits no distension. There is no abdominal tenderness.  Musculoskeletal: Normal range of motion.  Neurological: She is alert and oriented to person, place, and time.  Skin: Skin is warm and dry.  Psychiatric: She has a normal mood and affect. Her behavior is normal.     Assessment/Plan 68 yo female with superficial neck mass -excision of superficial neck mass -outpatient procedure  Mickeal Skinner, MD 11/14/2018, 8:31 AM

## 2018-11-14 NOTE — Op Note (Signed)
Preoperative diagnosis: neck mass  Postoperative diagnosis: same   Procedure: excision of 2cm neck mass involving platysma muscles  Surgeon: Gurney Maxin, M.D.  Asst: none  Anesthesia: MAC with local  Indications for procedure: Rachel Boone is a 68 y.o. year old female with symptoms of enlarging neck mass in the upper neck.  Description of procedure: The patient was brought into the operative suite. Anesthesia was administered with Monitored Local Anesthesia with Sedation. WHO checklist was applied. The patient was then placed in supine position. The area was prepped and draped in the usual sterile fashion.  Next, Marcaine was infused into the area. Once anesthesia was found intact a transverse incision was made along a neck crease. Cautery was used to divide tissue until a fatty tissue was encountered. The tissue was encircled with blunt and sharp dissection and removed. There appeared to be a deeper more lipoma like mass beneath the removed tissue that was in contact with the platysma and superficial to the strap muscles. The mass was dissected free with cautery and blunt dissection and removed. Hemostasis was found intact. The platysma was reapproximated with 3-0 vicryl interrupted sutures. The skin was closed with 4-0 monocryl and dermabond was put in place for dressing  Findings: 2cm fatty mass at the level of the platysma  Specimen: neck mass  Implant: none   Blood loss: <41m  Local anesthesia: 20 ml marcaine   Complications: none  LGurney Maxin M.D. General, Bariatric, & Minimally Invasive Surgery [facility]

## 2018-11-15 ENCOUNTER — Encounter (HOSPITAL_BASED_OUTPATIENT_CLINIC_OR_DEPARTMENT_OTHER): Payer: Self-pay | Admitting: General Surgery

## 2018-11-27 ENCOUNTER — Telehealth: Payer: Self-pay | Admitting: Emergency Medicine

## 2018-11-27 NOTE — Telephone Encounter (Signed)
Please advise I don't see any notes about it. Please send to clinical pool.

## 2018-11-27 NOTE — Telephone Encounter (Signed)
Copied from [place] [id number]. Topic: Quick Communication - See Telephone Encounter >> Nov 27, 2018  2:31 PM Bea Graff, NT wrote: CRM for notification. See Telephone encounter for: 11/27/18. Pt is wanting to see if Dr. Mitchel Honour received results from her surgeon where she had a lump in her neck removed on 11/14/2018? If so, she would like the results of the pathology discussed with her.

## 2018-11-27 NOTE — Telephone Encounter (Signed)
Have not seen any results yet but the surgeon should be the one discussing results with her.  Thanks.

## 2019-01-18 ENCOUNTER — Telehealth: Payer: Self-pay | Admitting: Emergency Medicine

## 2019-01-18 NOTE — Telephone Encounter (Signed)
Sent provider a message about Rx request, waiting for response.

## 2019-01-18 NOTE — Telephone Encounter (Signed)
Copied from [place] [id number]. Topic: Quick Communication - Rx Refill/Question >> Jan 18, 2019  8:34 AM Reyne Dumas L wrote: Medication: Naproxen 500mg  2x daily  Has the patient contacted their pharmacy? Yes - no refills left (Agent: If no, request that the patient contact the pharmacy for the refill.) (Agent: If yes, when and what did the pharmacy advise?)  Preferred Pharmacy (with phone number or street name): [place], [place] [phone] (Phone) [phone] (Fax)  Agent: Please be advised that RX refills may take up to 3 business days. We ask that you follow-up with your pharmacy.

## 2019-01-25 NOTE — Telephone Encounter (Signed)
Please see note below. 

## 2019-01-25 NOTE — Telephone Encounter (Signed)
[phone] Pt is requesting this medicine Please call back with status

## 2019-01-27 NOTE — Telephone Encounter (Signed)
Aleve is the same as naproxen.  Why does she need a prescription for this?  Thanks.

## 2019-03-12 ENCOUNTER — Ambulatory Visit: Payer: PPO | Admitting: Internal Medicine

## 2019-10-14 ENCOUNTER — Other Ambulatory Visit: Payer: Self-pay | Admitting: Emergency Medicine

## 2019-10-14 DIAGNOSIS — Z1231 Encounter for screening mammogram for malignant neoplasm of breast: Secondary | ICD-10-CM

## 2019-11-20 ENCOUNTER — Other Ambulatory Visit: Payer: Self-pay

## 2019-11-20 ENCOUNTER — Other Ambulatory Visit: Payer: Self-pay | Admitting: Emergency Medicine

## 2019-11-20 ENCOUNTER — Ambulatory Visit
Admission: RE | Admit: 2019-11-20 | Discharge: 2019-11-20 | Disposition: A | Discharge status: Home / Self Care | Payer: PPO | Source: Ambulatory Visit | Attending: Emergency Medicine | Admitting: Emergency Medicine

## 2019-11-20 DIAGNOSIS — Z1231 Encounter for screening mammogram for malignant neoplasm of breast: Secondary | ICD-10-CM

## 2019-11-20 DIAGNOSIS — R928 Other abnormal and inconclusive findings on diagnostic imaging of breast: Secondary | ICD-10-CM

## 2019-11-21 ENCOUNTER — Telehealth: Payer: Self-pay | Admitting: *Deleted

## 2019-11-21 NOTE — Telephone Encounter (Signed)
Faxed signed URGENT request form back to [facility] as requested. Confirmation page 1:16 pm.

## 2019-11-26 ENCOUNTER — Ambulatory Visit
Admission: RE | Admit: 2019-11-26 | Discharge: 2019-11-26 | Disposition: A | Discharge status: Home / Self Care | Payer: PPO | Source: Ambulatory Visit | Attending: Emergency Medicine | Admitting: Emergency Medicine

## 2019-11-26 ENCOUNTER — Other Ambulatory Visit: Payer: Self-pay | Admitting: Emergency Medicine

## 2019-11-26 ENCOUNTER — Other Ambulatory Visit: Payer: Self-pay

## 2019-11-26 DIAGNOSIS — R928 Other abnormal and inconclusive findings on diagnostic imaging of breast: Secondary | ICD-10-CM | POA: Diagnosis not present

## 2019-11-26 DIAGNOSIS — R922 Inconclusive mammogram: Secondary | ICD-10-CM | POA: Diagnosis not present

## 2019-11-26 DIAGNOSIS — N6489 Other specified disorders of breast: Secondary | ICD-10-CM | POA: Diagnosis not present

## 2019-11-28 ENCOUNTER — Other Ambulatory Visit: Payer: PPO

## 2019-11-29 ENCOUNTER — Other Ambulatory Visit: Payer: Self-pay | Admitting: Emergency Medicine

## 2019-11-29 ENCOUNTER — Ambulatory Visit
Admission: RE | Admit: 2019-11-29 | Discharge: 2019-11-29 | Disposition: A | Discharge status: Home / Self Care | Payer: PPO | Source: Ambulatory Visit | Attending: Emergency Medicine | Admitting: Emergency Medicine

## 2019-11-29 ENCOUNTER — Other Ambulatory Visit: Payer: Self-pay

## 2019-11-29 DIAGNOSIS — R928 Other abnormal and inconclusive findings on diagnostic imaging of breast: Secondary | ICD-10-CM

## 2019-11-29 DIAGNOSIS — R590 Localized enlarged lymph nodes: Secondary | ICD-10-CM | POA: Diagnosis not present

## 2019-11-29 DIAGNOSIS — N6489 Other specified disorders of breast: Secondary | ICD-10-CM | POA: Diagnosis not present

## 2019-12-05 ENCOUNTER — Other Ambulatory Visit: Payer: PPO

## 2020-01-21 IMAGING — MG DIGITAL SCREENING BILATERAL MAMMOGRAM WITH TOMO AND CAD
8 series · 9 of 24 positions shown · non-contrast
Comparison: Previous exam(s).

CLINICAL DATA: Screening.

EXAM:
DIGITAL SCREENING BILATERAL MAMMOGRAM WITH TOMO AND CAD

[R CC synth-2D]
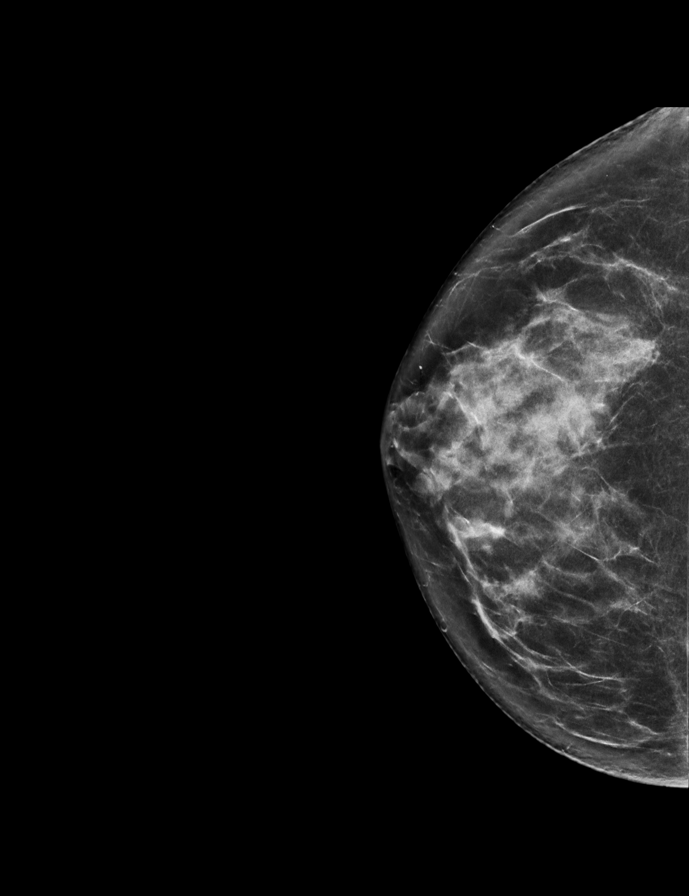

[R MLO synth-2D]
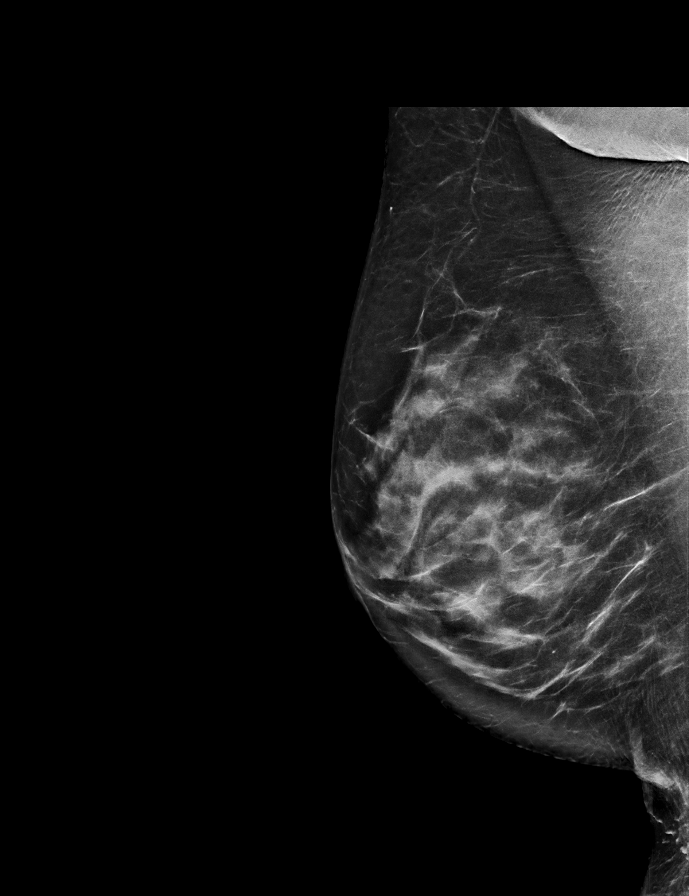

[L MLO synth-2D]
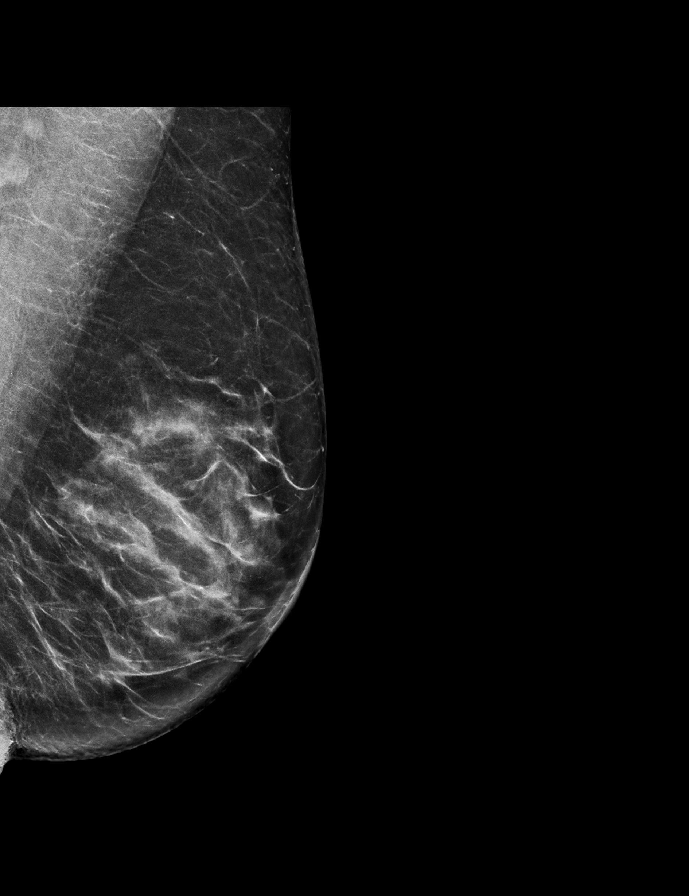

[L CC synth-2D]
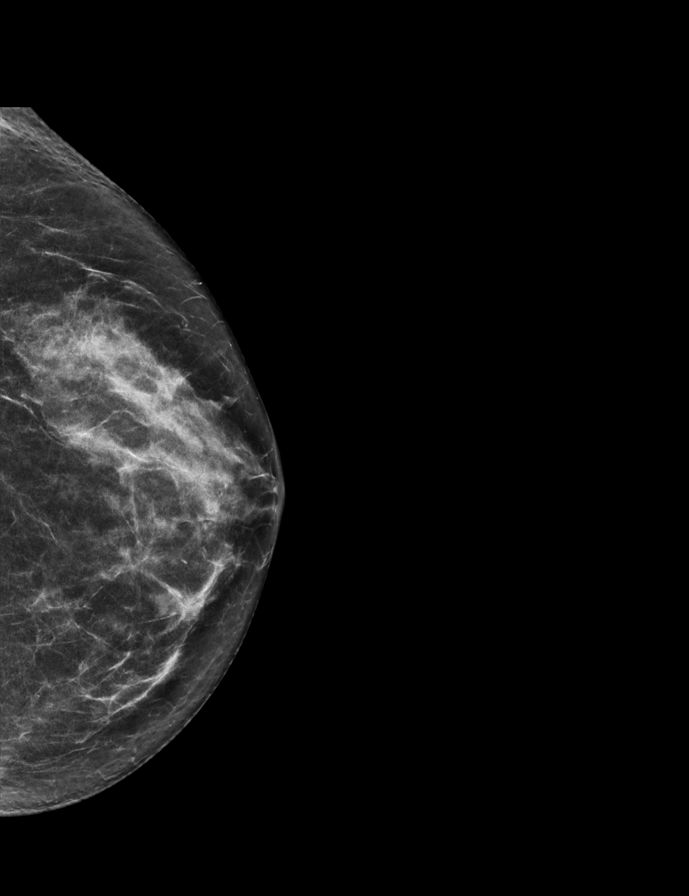

[L MLO tomo · 2 of 70 frames shown]
[frame 23/70]
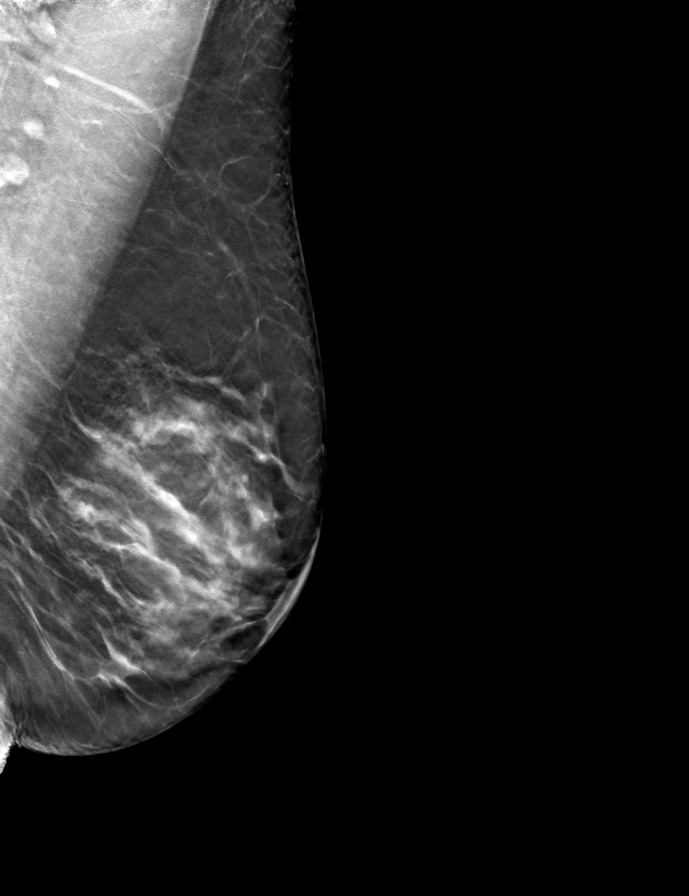
[frame 35/70]
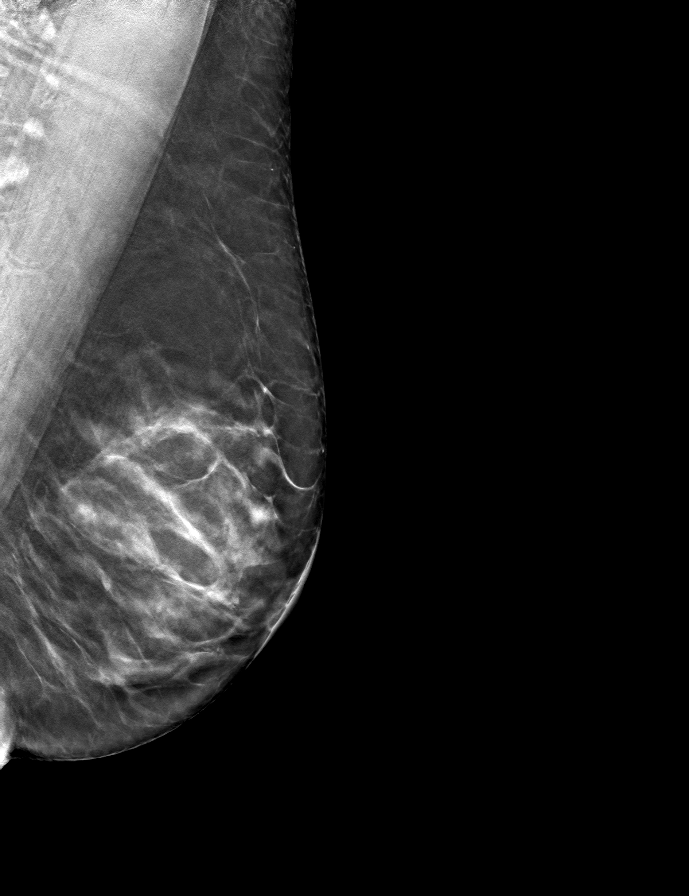

[R CC tomo · tomo slice 35/70.0]
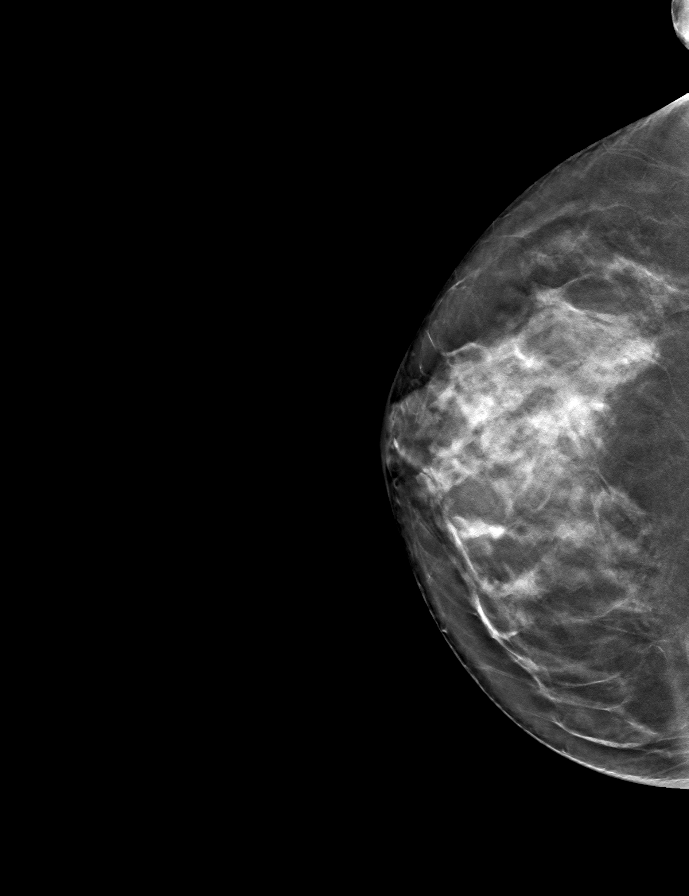

[L CC tomo · tomo slice 32/63.0]
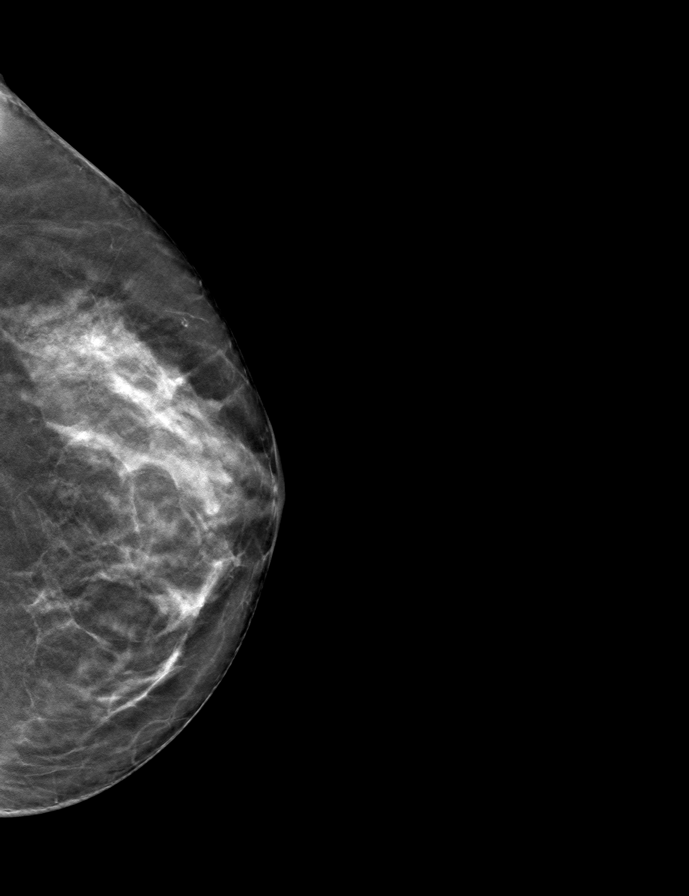

[R MLO tomo · tomo slice 37/74.0]
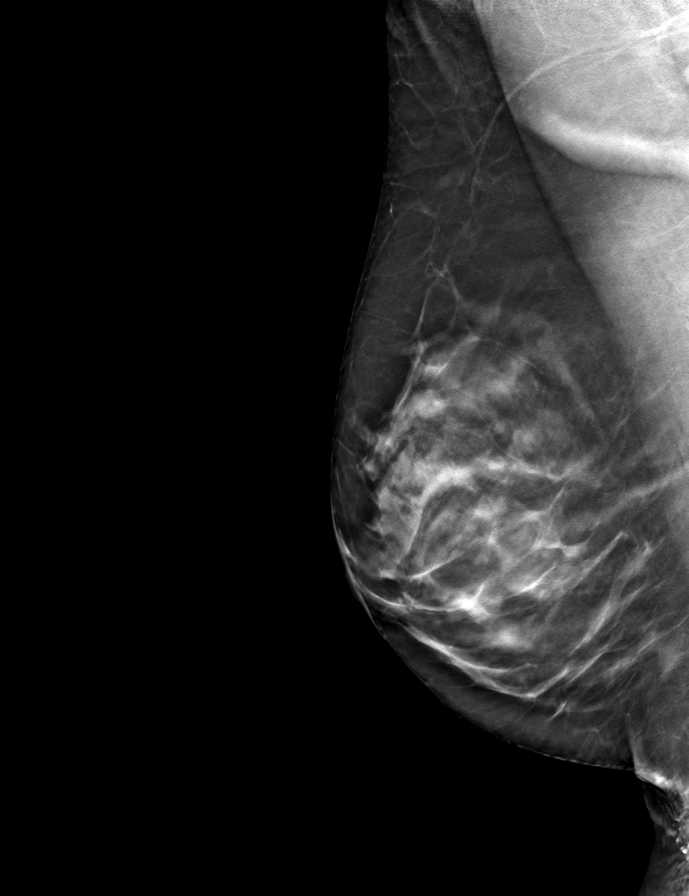

[9 of 24 positions shown; findings below may reference images not displayed]

ACR Breast Density Category c: The breast tissue is heterogeneously
dense, which may obscure small masses.
FINDINGS: There are no findings suspicious for malignancy. Images were
processed with CAD.
IMPRESSION: No mammographic evidence of malignancy. A result letter of this
screening mammogram will be mailed directly to the patient.

RECOMMENDATION:
Screening mammogram in one year. (Code:FT-U-LHB)

BI-RADS CATEGORY  1: Negative.

## 2020-02-05 ENCOUNTER — Encounter: Payer: Self-pay | Admitting: Emergency Medicine

## 2020-02-08 DIAGNOSIS — Z03818 Encounter for observation for suspected exposure to other biological agents ruled out: Secondary | ICD-10-CM | POA: Diagnosis not present

## 2020-02-08 DIAGNOSIS — Z20828 Contact with and (suspected) exposure to other viral communicable diseases: Secondary | ICD-10-CM | POA: Diagnosis not present

## 2020-02-11 ENCOUNTER — Telehealth (INDEPENDENT_AMBULATORY_CARE_PROVIDER_SITE_OTHER): Payer: PPO | Admitting: Emergency Medicine

## 2020-02-11 ENCOUNTER — Other Ambulatory Visit: Payer: Self-pay

## 2020-02-11 ENCOUNTER — Encounter: Payer: Self-pay | Admitting: Emergency Medicine

## 2020-02-11 DIAGNOSIS — M1990 Unspecified osteoarthritis, unspecified site: Secondary | ICD-10-CM

## 2020-02-11 DIAGNOSIS — K219 Gastro-esophageal reflux disease without esophagitis: Secondary | ICD-10-CM | POA: Diagnosis not present

## 2020-02-11 MED ORDER — NAPROXEN 500 MG PO TABS: 500.0000 mg | ORAL_TABLET | Freq: Two times a day (BID) | ORAL | 5 refills | Status: DC

## 2020-02-11 MED ORDER — PANTOPRAZOLE SODIUM 40 MG PO TBEC: 40.0000 mg | DELAYED_RELEASE_TABLET | Freq: Every day | ORAL | 3 refills | Status: DC

## 2020-02-11 NOTE — Progress Notes (Signed)
Telemedicine Encounter- SOAP NOTE Established Patient MyChart video conference. This video telephone encounter was conducted with the patient's (or proxy's) verbal consent via video audio telecommunications: yes/no: Yes Patient was instructed to have this encounter in a suitably private space; and to only have persons present to whom they give permission to participate. In addition, patient identity was confirmed by use of name plus two identifiers (DOB and address).  I discussed the limitations, risks, security and privacy concerns of performing an evaluation and management service by telephone and the availability of in person appointments. I also discussed with the patient that there may be a patient responsible charge related to this service. The patient expressed understanding and agreed to proceed.  I spent a total of TIME; 0 MIN TO 60 MIN: 15 minutes talking with the patient or their proxy.  Chief Complaint  Patient presents with  . Gastroesophageal Reflux  . Arthritis    Subjective   Rachel Boone is a 69 y.o. female established patient. Telephone visit today requesting medication refills for Naprosyn and Protonix.  Has history of arthritis and GERD.  No other complaints or medical concerns today.  HPI   Patient Active Problem List   Diagnosis Date Noted  . Multinodular goiter 09/10/2018  . Gastro-esophageal reflux 05/23/2014  . Arthritis 05/23/2014    Past Medical History:  Diagnosis Date  . Allergy    SEASONAL  . Arthritis    Phreesia 02/09/2020  . Cataract    Bilateral  . DDD (degenerative disc disease), cervical 04/2001  . Endocervical polyp    pt unaware  . Family history of anesthesia complication    Family history of PONV  . Gastritis 05/27/2011   Noted on EGD  . GERD (gastroesophageal reflux disease)   . Hammertoe    Bilateral  . Heart murmur    since birth, 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  . Hiatal hernia 05/27/2011   Small, Noted on EGD  . History of  colon polyps 05/29/2015   Noted on colonscopy, pt unaware  . Hyperlipidemia   . Insomnia   . Lipoma 07/30/2018   1.8cmx1.5cmx80mm, noted on CT neck  . Mitral regurgitation    patient denied 10/21/13  . Multinodular thyroid 07/30/2018   Noted on CT neck  . OA (osteoarthritis)   . PONV (postoperative nausea and vomiting)    takes meds thry iv to prevent nausea  . Pupil dilation    Right eye  . Pupil irregular    right eye legally blind right eye  . Restless leg syndrome   . Wears partial dentures    upper    Current Outpatient Medications  Medication Sig Dispense Refill  . ibuprofen (ADVIL,MOTRIN) 800 MG tablet Take 1 tablet (800 mg total) by mouth every 8 (eight) hours as needed. 30 tablet 0  . naproxen (NAPROSYN) 500 MG tablet Take 1 tablet (500 mg total) by mouth 2 (two) times daily with a meal. 60 tablet 5  . pantoprazole (PROTONIX) 40 MG tablet Take 1 tablet (40 mg total) by mouth daily. 90 tablet 3   No current facility-administered medications for this visit.    Allergies  Allergen Reactions  . Other     Social History   Socioeconomic History  . Marital status: Single    Spouse name: Not on file  . Number of children: Not on file  . Years of education: Not on file  . Highest education level: Not on file  Occupational History  . Occupation: Retired  Comment: [employer] Department  Tobacco Use  . Smoking status: Former Smoker    Quit date: 09/12/1989    Years since quitting: 30.4  . Smokeless tobacco: Never Used  Substance and Sexual Activity  . Alcohol use: Yes    Comment: social beer  . Drug use: No  . Sexual activity: Not on file  Other Topics Concern  . Not on file  Social History Narrative   Daily caffeine use    Social Determinants of Health   Financial Resource Strain:   . Difficulty of Paying Living Expenses:   Food Insecurity:   . Worried About Charity fundraiser in the Last Year:   . Arboriculturist in the Last Year:     Transportation Needs:   . Film/video editor (Medical):   Marland Kitchen Lack of Transportation (Non-Medical):   Physical Activity:   . Days of Exercise per Week:   . Minutes of Exercise per Session:   Stress:   . Feeling of Stress :   Social Connections:   . Frequency of Communication with Friends and Family:   . Frequency of Social Gatherings with Friends and Family:   . Attends Religious Services:   . Active Member of Clubs or Organizations:   . Attends Archivist Meetings:   Marland Kitchen Marital Status:   Intimate Partner Violence:   . Fear of Current or Ex-Partner:   . Emotionally Abused:   Marland Kitchen Physically Abused:   . Sexually Abused:     Review of Systems  Constitutional: Negative.  Negative for chills and fever.  HENT: Negative.  Negative for congestion and sore throat.   Respiratory: Negative.  Negative for cough and shortness of breath.   Cardiovascular: Negative.  Negative for chest pain and palpitations.  Gastrointestinal: Positive for heartburn. Negative for abdominal pain, blood in stool, diarrhea, melena, nausea and vomiting.  Genitourinary: Negative.  Negative for hematuria.  Musculoskeletal: Positive for joint pain.  Skin: Negative.  Negative for rash.  Neurological: Negative.  Negative for dizziness and headaches.  All other systems reviewed and are negative.   Objective  Alert and oriented x3 in no apparent respiratory distress Vitals as reported by the patient: There were no vitals filed for this visit.  [place]ath was seen today for gastroesophageal reflux and arthritis.  Diagnoses and all orders for this visit:  Arthritis -     naproxen (NAPROSYN) 500 MG tablet; Take 1 tablet (500 mg total) by mouth 2 (two) times daily with a meal.  Gastroesophageal reflux disease without esophagitis -     pantoprazole (PROTONIX) 40 MG tablet; Take 1 tablet (40 mg total) by mouth daily.     I discussed the assessment and treatment plan with the patient. The patient was  provided an opportunity to ask questions and all were answered. The patient agreed with the plan and demonstrated an understanding of the instructions.   The patient was advised to call back or seek an in-person evaluation if the symptoms worsen or if the condition fails to improve as anticipated.  I provided 15 minutes of non-face-to-face time during this encounter.  Horald Pollen, MD  Primary Care at [facility]

## 2020-02-12 ENCOUNTER — Other Ambulatory Visit: Payer: PPO

## 2020-06-08 DIAGNOSIS — Z20828 Contact with and (suspected) exposure to other viral communicable diseases: Secondary | ICD-10-CM | POA: Diagnosis not present

## 2020-07-23 ENCOUNTER — Other Ambulatory Visit: Payer: Self-pay | Admitting: Emergency Medicine

## 2020-07-23 DIAGNOSIS — M1990 Unspecified osteoarthritis, unspecified site: Secondary | ICD-10-CM

## 2020-11-05 ENCOUNTER — Ambulatory Visit: Payer: PPO | Admitting: Orthopedic Surgery

## 2021-01-14 ENCOUNTER — Encounter: Payer: Self-pay | Admitting: Orthopedic Surgery

## 2021-01-14 ENCOUNTER — Ambulatory Visit (INDEPENDENT_AMBULATORY_CARE_PROVIDER_SITE_OTHER): Payer: PPO

## 2021-01-14 ENCOUNTER — Ambulatory Visit: Payer: PPO | Admitting: Physician Assistant

## 2021-01-14 ENCOUNTER — Other Ambulatory Visit: Payer: Self-pay

## 2021-01-14 DIAGNOSIS — M542 Cervicalgia: Secondary | ICD-10-CM | POA: Diagnosis not present

## 2021-01-14 DIAGNOSIS — M25511 Pain in right shoulder: Secondary | ICD-10-CM

## 2021-01-14 DIAGNOSIS — G8929 Other chronic pain: Secondary | ICD-10-CM | POA: Diagnosis not present

## 2021-01-14 DIAGNOSIS — M5030 Other cervical disc degeneration, unspecified cervical region: Secondary | ICD-10-CM | POA: Diagnosis not present

## 2021-01-14 MED ORDER — LIDOCAINE HCL 1 % IJ SOLN
5.0000 mL | INTRAMUSCULAR | Status: AC | PRN
  Administered 2021-01-14: 5 mL

## 2021-01-14 MED ORDER — METHYLPREDNISOLONE ACETATE 40 MG/ML IJ SUSP
40.0000 mg | INTRAMUSCULAR | Status: AC | PRN
  Administered 2021-01-14: 40 mg via INTRA_ARTICULAR

## 2021-01-14 NOTE — Progress Notes (Signed)
Office Visit Note   Patient: Rachel Boone           Date of Birth: July 25, 1951           MRN: [id number] Visit Date: 01/14/2021              Requested by: Horald Pollen, MD [place],  [place] [id number] PCP: Horald Pollen, MD  Chief Complaint  Patient presents with  . Right Shoulder - Pain  . Neck - Pain      HPI: Patient is a 70 year old woman who presents complaining of 43-month history of right shoulder pain laterally.  She has been doing her home exercise program as advised by a friend.  She is right-hand dominant she states she does wake up at night also with her index and long fingers going to sleep.  She also complains of numbness and tingling going down her right arm.  Assessment & Plan: Visit Diagnoses:  1. Chronic right shoulder pain   2. Neck pain   3. Other cervical disc degeneration, unspecified cervical region     Plan: Patient does seem to have advanced degenerative disc disease of the cervical spine with cervical radiculopathy in the right upper extremity as well as impingement syndrome of the right shoulder.  Her shoulder was injected we will reevaluate in 4 weeks.  Discussed that we may need to proceed with an MRI scan of her cervical spine.  Follow-Up Instructions: Return in about 4 weeks (around 02/11/2021).   Ortho Exam  Patient is alert, oriented, no adenopathy, well-dressed, normal affect, normal respiratory effort. Examination patient has no thoracic outlet tenderness on either shoulder she is tender to palpation paraspinous muscles in the cervical spine.  There is no focal motor weakness in either upper extremity good grip strength she does have arthritic changes of the DIP and PIP joint of her fingers.  Patient does have pain with Neer and Hawkins impingement test and pain with drop arm test of right shoulder there is no adhesive capsulitis she only has active abduction and flexion to 90 degrees.  Imaging: XR Cervical Spine  2 or 3 views  Result Date: 01/14/2021 2 view radiographs of the cervical spine shows advanced degenerative arthritis of the cervical spine.  There is joint space collapse C4-C5 , C7 -6 T1.  XR Shoulder 1V Right  Result Date: 01/14/2021 2 view radiographs of the right shoulder shows a good subacromial space there is some degenerative changes of the [facility] joint.  The glenohumeral joint is congruent.  No images are attached to the encounter.  Labs: Lab Results  Component Value Date   HGBA1C 5.5 01/13/2017     Lab Results  Component Value Date   ALBUMIN 4.7 07/24/2018   ALBUMIN 4.4 01/13/2017   ALBUMIN 4.3 05/04/2015    Lab Results  Component Value Date   MG 2.2 05/04/2015   MG 2.1 05/23/2014   Lab Results  Component Value Date   VD25OH 36 05/04/2015   VD25OH 54 05/23/2014    No results found for: PREALBUMIN CBC EXTENDED Latest Ref Rng & Units 01/13/2017 05/04/2015 05/23/2014  WBC 3.4 - 10.8 x10E3/uL 5.6 4.6 4.8  RBC 3.77 - 5.28 x10E6/uL 4.79 5.10 5.23  HGB 11.1 - 15.9 g/dL 13.9 14.1 14.9  HCT 34.0 - 46.6 % 42.3 43.5 46.3  PLT 150 - 379 x10E3/uL 268 - -  NEUTROABS 1.4 - 7.0 x10E3/uL 3.6 - -  LYMPHSABS 0.7 - 3.1 x10E3/uL 1.6 - -  There is no height or weight on file to calculate BMI.  Orders:  Orders Placed This Encounter  Procedures  . XR Shoulder 1V Right  . XR Cervical Spine 2 or 3 views   No orders of the defined types were placed in this encounter.    Procedures: Large Joint Inj: R subacromial bursa on 01/14/2021 9:10 AM Indications: diagnostic evaluation and pain Details: 22 G 1.5 in needle, posterior approach  Arthrogram: No  Medications: 5 mL lidocaine 1 %; 40 mg methylPREDNISolone acetate 40 MG/ML Outcome: tolerated well, no immediate complications Procedure, treatment alternatives, risks and benefits explained, specific risks discussed. Consent was given by the patient. Immediately prior to procedure a time out was called to verify the correct patient,  procedure, equipment, support staff and site/side marked as required. Patient was prepped and draped in the usual sterile fashion.      Clinical Data: No additional findings.  ROS:  All other systems negative, except as noted in the HPI. Review of Systems  Objective: Vital Signs: There were no vitals taken for this visit.  Specialty Comments:  No specialty comments available.  PMFS History: Patient Active Problem List   Diagnosis Date Noted  . Multinodular goiter 09/10/2018  . Gastro-esophageal reflux 05/23/2014  . Arthritis 05/23/2014   Past Medical History:  Diagnosis Date  . Allergy    SEASONAL  . Arthritis    Phreesia 02/09/2020  . Cataract    Bilateral  . DDD (degenerative disc disease), cervical 04/2001  . Endocervical polyp    pt unaware  . Family history of anesthesia complication    Family history of PONV  . Gastritis 05/27/2011   Noted on EGD  . GERD (gastroesophageal reflux disease)   . Hammertoe    Bilateral  . Heart murmur    since birth, 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  . Hiatal hernia 05/27/2011   Small, Noted on EGD  . History of colon polyps 05/29/2015   Noted on colonscopy, pt unaware  . Hyperlipidemia   . Insomnia   . Lipoma 07/30/2018   1.8cmx1.5cmx52mm, noted on CT neck  . Mitral regurgitation    patient denied 10/21/13  . Multinodular thyroid 07/30/2018   Noted on CT neck  . OA (osteoarthritis)   . PONV (postoperative nausea and vomiting)    takes meds thry iv to prevent nausea  . Pupil dilation    Right eye  . Pupil irregular    right eye legally blind right eye  . Restless leg syndrome   . Wears partial dentures    upper    Family History  Problem Relation Age of Onset  . Lung cancer Mother   . Diabetes type II Mother   . Heart disease Father   . Colon polyps Sister        adenomatous  . Hypertension Brother   . Hypertension Sister   . Diabetes type II Sister   . Colon cancer Neg Hx   . Breast cancer Neg Hx     Past Surgical  History:  Procedure Laterality Date  . ANTERIOR LATERAL LUMBAR FUSION 4 LEVELS  07/19/2012   Procedure: ANTERIOR LATERAL LUMBAR FUSION 4 LEVELS;  Surgeon: Hosie Spangle, MD;  Location: [facility];  Service: Neurosurgery;  Laterality: N/A;  Lumbar one-two, lumbar two-three, lumbar three-four, lumbar four-five, lumbar five-sacral one anterolateral lumbar fusion with Lumbar one-sacral one posterior instrumentation  . CATARACT EXTRACTION     bilateral  . colonscopy  8 yrs ago  . ESOPHAGOGASTRODUODENOSCOPY  05/27/2011  . EYE SURGERY    . HAMMER TOE SURGERY Bilateral   . LIPOMA EXCISION N/A 11/14/2018   Procedure: EXCISION OF NECK LIPOMA;  Surgeon: Kieth Brightly, Arta Bruce, MD;  Location: [place];  Service: General;  Laterality: N/A;  . LUMBAR PERCUTANEOUS PEDICLE SCREW 3 LEVEL  07/19/2012   Procedure: LUMBAR PERCUTANEOUS PEDICLE SCREW 3 LEVEL;  Surgeon: Hosie Spangle, MD;  Location: [facility];  Service: Neurosurgery;  Laterality: Bilateral;  Lumbar one-two, lumbar two-three, lumbar three-four, lumbar four-five, lumbar five-sacral one anterolateral lumbar fusion with Lumbar one-sacral one posterior instrumentation  . PUPILLOPLASTY Right   . SHOULDER ARTHROSCOPY Left 10/22/2013   Procedure: ARTHROSCOPY SHOULDER;  Surgeon: Newt Minion, MD;  Location: [place];  Service: Orthopedics;  Laterality: Left;  Left Shoulder Arthroscopy, Debridement, and Decompression, subacromial decompression and distal clavicle resection  . SPINE SURGERY N/A    Phreesia 02/09/2020  . THUMB FUSION     LEFT   Social History   Occupational History  . Occupation: Retired    Comment: [employer]  Tobacco Use  . Smoking status: Former Smoker    Quit date: 09/12/1989    Years since quitting: 31.3  . Smokeless tobacco: Never Used  Vaping Use  . Vaping Use: Never used  Substance and Sexual Activity  . Alcohol use: Yes    Comment: social beer  . Drug use: No  . Sexual activity:  Not on file

## 2021-01-15 ENCOUNTER — Other Ambulatory Visit: Payer: Self-pay | Admitting: Emergency Medicine

## 2021-01-15 DIAGNOSIS — M1990 Unspecified osteoarthritis, unspecified site: Secondary | ICD-10-CM

## 2021-01-15 DIAGNOSIS — K219 Gastro-esophageal reflux disease without esophagitis: Secondary | ICD-10-CM

## 2021-02-11 ENCOUNTER — Encounter: Payer: Self-pay | Admitting: Orthopedic Surgery

## 2021-02-11 ENCOUNTER — Ambulatory Visit: Payer: PPO | Admitting: Physician Assistant

## 2021-02-11 DIAGNOSIS — M542 Cervicalgia: Secondary | ICD-10-CM

## 2021-02-11 DIAGNOSIS — G8929 Other chronic pain: Secondary | ICD-10-CM

## 2021-02-11 DIAGNOSIS — M25511 Pain in right shoulder: Secondary | ICD-10-CM | POA: Diagnosis not present

## 2021-02-11 MED ORDER — DIAZEPAM 5 MG PO TABS: 5.0000 mg | ORAL_TABLET | Freq: Once | ORAL | 0 refills | Status: AC

## 2021-02-11 NOTE — Progress Notes (Signed)
Office Visit Note   Patient: Rachel Boone           Date of Birth: [birth year]           MRN: [id number] Visit Date: 02/11/2021              Requested by: Horald Pollen, MD [place],  [place] [address] PCP: Horald Pollen, MD  Chief Complaint  Patient presents with  . Right Shoulder - Follow-up    S/p injection 01/14/21      HPI: Patient presents today for follow-up on 2 items.  At her last visit she was complaining of impingement symptoms in her right shoulder.  She was given an injection.  She said this gave her little to no relief.  May be temporarily.  She is also continuing to complain of pain that shoots down her arm and numbness in her index middle and ring finger.  She is very active and both of these issues are concerning to her.  She takes Naprosyn.  She does not want to take oral prednisone Assessment & Plan: Visit Diagnoses:  1. Chronic right shoulder pain   2. Neck pain     Plan: We will plan on MRIs of the cervical spine and the shoulder.  She has had a remote history of a shoulder arthroscopy on the left and did quite well.  This may be her option if there are findings consistent with rotator cuff issues.  With regards to her C-spine.  She is open to epidural steroid injections.  Follow-Up Instructions: No follow-ups on file.   Ortho Exam  Patient is alert, oriented, no adenopathy, well-dressed, normal affect, normal respiratory effort. Examination of her right arm she has good Grip strength.  She does have decreased and altered sensation in the index long and ring finger.  No tenderness specifically in the spine.  She does have pain and impingement findings in the shoulder consistent with her previous exam she is especially painful with resisted abduction and internal rotation behind her back  Imaging: No results found. No images are attached to the encounter.  Labs: Lab Results  Component Value Date   HGBA1C 5.5 01/13/2017      Lab Results  Component Value Date   ALBUMIN 4.7 07/24/2018   ALBUMIN 4.4 01/13/2017   ALBUMIN 4.3 05/04/2015    Lab Results  Component Value Date   MG 2.2 05/04/2015   MG 2.1 05/23/2014   Lab Results  Component Value Date   VD25OH 36 05/04/2015   VD25OH 54 05/23/2014    No results found for: PREALBUMIN CBC EXTENDED Latest Ref Rng & Units 01/13/2017 05/04/2015 05/23/2014  WBC 3.4 - 10.8 x10E3/uL 5.6 4.6 4.8  RBC 3.77 - 5.28 x10E6/uL 4.79 5.10 5.23  HGB 11.1 - 15.9 g/dL 13.9 14.1 14.9  HCT 34.0 - 46.6 % 42.3 43.5 46.3  PLT 150 - 379 x10E3/uL 268 - -  NEUTROABS 1.4 - 7.0 x10E3/uL 3.6 - -  LYMPHSABS 0.7 - 3.1 x10E3/uL 1.6 - -     There is no height or weight on file to calculate BMI.  Orders:  Orders Placed This Encounter  Procedures  . MR Cervical Spine w/o contrast  . MR SHOULDER RIGHT WO CONTRAST   Meds ordered this encounter  Medications  . diazepam (VALIUM) 5 MG tablet    Sig: Take 1 tablet (5 mg total) by mouth once for 1 dose. Take 1 tablet 1 hour before procedure then 1  30 minutes before procedure    Dispense:  2 tablet    Refill:  0     Procedures: No procedures performed  Clinical Data: No additional findings.  ROS:  All other systems negative, except as noted in the HPI. Review of Systems  Objective: Vital Signs: There were no vitals taken for this visit.  Specialty Comments:  No specialty comments available.  PMFS History: Patient Active Problem List   Diagnosis Date Noted  . Multinodular goiter 09/10/2018  . Gastro-esophageal reflux 05/23/2014  . Arthritis 05/23/2014   Past Medical History:  Diagnosis Date  . Allergy    SEASONAL  . Arthritis    Phreesia 02/09/2020  . Cataract    Bilateral  . DDD (degenerative disc disease), cervical 04/2001  . Endocervical polyp    pt unaware  . Family history of anesthesia complication    Family history of PONV  . Gastritis 05/27/2011   Noted on EGD  . GERD (gastroesophageal reflux  disease)   . Hammertoe    Bilateral  . Heart murmur    since birth, 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  . Hiatal hernia 05/27/2011   Small, Noted on EGD  . History of colon polyps 05/29/2015   Noted on colonscopy, pt unaware  . Hyperlipidemia   . Insomnia   . Lipoma 07/30/2018   1.8cmx1.5cmx6mm, noted on CT neck  . Mitral regurgitation    patient denied 10/21/13  . Multinodular thyroid 07/30/2018   Noted on CT neck  . OA (osteoarthritis)   . PONV (postoperative nausea and vomiting)    takes meds thry iv to prevent nausea  . Pupil dilation    Right eye  . Pupil irregular    right eye legally blind right eye  . Restless leg syndrome   . Wears partial dentures    upper    Family History  Problem Relation Age of Onset  . Lung cancer Mother   . Diabetes type II Mother   . Heart disease Father   . Colon polyps Sister        adenomatous  . Hypertension Brother   . Hypertension Sister   . Diabetes type II Sister   . Colon cancer Neg Hx   . Breast cancer Neg Hx     Past Surgical History:  Procedure Laterality Date  . ANTERIOR LATERAL LUMBAR FUSION 4 LEVELS  07/19/2012   Procedure: ANTERIOR LATERAL LUMBAR FUSION 4 LEVELS;  Surgeon: Hosie Spangle, MD;  Location: [facility];  Service: Neurosurgery;  Laterality: N/A;  Lumbar one-two, lumbar two-three, lumbar three-four, lumbar four-five, lumbar five-sacral one anterolateral lumbar fusion with Lumbar one-sacral one posterior instrumentation  . CATARACT EXTRACTION     bilateral  . colonscopy  8 yrs ago  . ESOPHAGOGASTRODUODENOSCOPY  05/27/2011  . EYE SURGERY    . HAMMER TOE SURGERY Bilateral   . LIPOMA EXCISION N/A 11/14/2018   Procedure: EXCISION OF NECK LIPOMA;  Surgeon: Kieth Brightly, Arta Bruce, MD;  Location: [facility];  Service: General;  Laterality: N/A;  . LUMBAR PERCUTANEOUS PEDICLE SCREW 3 LEVEL  07/19/2012   Procedure: LUMBAR PERCUTANEOUS PEDICLE SCREW 3 LEVEL;  Surgeon: Hosie Spangle, MD;  Location: [facility];   Service: Neurosurgery;  Laterality: Bilateral;  Lumbar one-two, lumbar two-three, lumbar three-four, lumbar four-five, lumbar five-sacral one anterolateral lumbar fusion with Lumbar one-sacral one posterior instrumentation  . PUPILLOPLASTY Right   . SHOULDER ARTHROSCOPY Left 10/22/2013   Procedure: ARTHROSCOPY SHOULDER;  Surgeon: Newt Minion, MD;  Location: [facility];  Service: Orthopedics;  Laterality: Left;  Left Shoulder Arthroscopy, Debridement, and Decompression, subacromial decompression and distal clavicle resection  . SPINE SURGERY N/A    Phreesia 02/09/2020  . THUMB FUSION     LEFT   Social History   Occupational History  . Occupation: Retired    Comment: [employer]  Tobacco Use  . Smoking status: Former Smoker    Quit date: 09/12/1989    Years since quitting: 31.4  . Smokeless tobacco: Never Used  Vaping Use  . Vaping Use: Never used  Substance and Sexual Activity  . Alcohol use: Yes    Comment: social beer  . Drug use: No  . Sexual activity: Not on file

## 2021-03-01 ENCOUNTER — Other Ambulatory Visit: Payer: PPO

## 2021-04-15 ENCOUNTER — Other Ambulatory Visit: Payer: Self-pay | Admitting: Emergency Medicine

## 2021-04-15 DIAGNOSIS — K219 Gastro-esophageal reflux disease without esophagitis: Secondary | ICD-10-CM

## 2021-04-15 DIAGNOSIS — M1990 Unspecified osteoarthritis, unspecified site: Secondary | ICD-10-CM

## 2021-04-19 ENCOUNTER — Other Ambulatory Visit: Payer: Self-pay | Admitting: Emergency Medicine

## 2021-04-19 DIAGNOSIS — K219 Gastro-esophageal reflux disease without esophagitis: Secondary | ICD-10-CM

## 2021-07-18 ENCOUNTER — Other Ambulatory Visit: Payer: Self-pay | Admitting: Emergency Medicine

## 2021-07-18 DIAGNOSIS — M1990 Unspecified osteoarthritis, unspecified site: Secondary | ICD-10-CM

## 2021-07-18 DIAGNOSIS — K219 Gastro-esophageal reflux disease without esophagitis: Secondary | ICD-10-CM

## 2021-09-23 ENCOUNTER — Ambulatory Visit: Payer: PPO | Admitting: Gastroenterology

## 2021-10-14 ENCOUNTER — Ambulatory Visit: Payer: PPO | Admitting: Gastroenterology

## 2021-11-18 ENCOUNTER — Ambulatory Visit: Payer: PPO | Admitting: Gastroenterology

## 2021-12-08 ENCOUNTER — Other Ambulatory Visit: Payer: Self-pay | Admitting: Emergency Medicine

## 2021-12-08 ENCOUNTER — Encounter: Payer: Self-pay | Admitting: *Deleted

## 2021-12-08 ENCOUNTER — Telehealth: Payer: Self-pay | Admitting: Emergency Medicine

## 2021-12-08 DIAGNOSIS — Z130 Encounter for screening for diseases of the blood and blood-forming organs and certain disorders involving the immune mechanism: Secondary | ICD-10-CM

## 2021-12-08 DIAGNOSIS — Z13220 Encounter for screening for lipoid disorders: Secondary | ICD-10-CM

## 2021-12-08 NOTE — Telephone Encounter (Signed)
Pt requesting an order for labs prior to her fu appt on 12-14-2021 ?

## 2021-12-08 NOTE — Telephone Encounter (Signed)
Orders for labs placed.  Thanks.

## 2021-12-13 ENCOUNTER — Ambulatory Visit: Payer: PPO | Admitting: Gastroenterology

## 2021-12-13 ENCOUNTER — Encounter: Payer: Self-pay | Admitting: Gastroenterology

## 2021-12-13 VITALS — BP 122/82 | HR 70 | Ht 67.0 in | Wt 162.2 lb

## 2021-12-13 DIAGNOSIS — Z8371 Family history of colonic polyps: Secondary | ICD-10-CM

## 2021-12-13 DIAGNOSIS — Z1211 Encounter for screening for malignant neoplasm of colon: Secondary | ICD-10-CM | POA: Diagnosis not present

## 2021-12-13 DIAGNOSIS — Z83719 Family history of colon polyps, unspecified: Secondary | ICD-10-CM

## 2021-12-13 DIAGNOSIS — Z1212 Encounter for screening for malignant neoplasm of rectum: Secondary | ICD-10-CM | POA: Diagnosis not present

## 2021-12-13 DIAGNOSIS — K219 Gastro-esophageal reflux disease without esophagitis: Secondary | ICD-10-CM | POA: Diagnosis not present

## 2021-12-13 MED ORDER — NA SULFATE-K SULFATE-MG SULF 17.5-3.13-1.6 GM/177ML PO SOLN: 1.0000 | Freq: Once | ORAL | 0 refills | Status: AC

## 2021-12-13 NOTE — Progress Notes (Addendum)
? ? ?History of Present Illness: This is a 71 year old female referred by Horald Pollen, * MD for the evaluation of GERD and a family history of colon polyps, brother and sister.  She relates many years of difficulties with acid reflux.  She has been maintained on pantoprazole 40 mg twice daily for many years.  This has significantly helped her reflux symptoms although she does get occasional breakthrough symptoms particularly when bending over when gardening.  No other gastrointestinal complaints.  Colonoscopy and EGD performed in 2012 reviewed.  Denies weight loss, abdominal pain, constipation, diarrhea, change in stool caliber, melena, hematochezia, nausea, vomiting, dysphagia, chest pain. ? ? ? ?No Known Allergies ?Outpatient Medications Prior to Visit  ?Medication Sig Dispense Refill  ? naproxen (NAPROSYN) 500 MG tablet TAKE 1 TABLET BY MOUTH TWICE DAILY AS NEEDED FOR MODERATE PAIN 60 tablet 0  ? pantoprazole (PROTONIX) 40 MG tablet Take 40 mg by mouth 2 (two) times daily.    ? polyethylene glycol (MIRALAX MIX-IN PAX) 17 g packet Take 17 g by mouth daily.    ? pantoprazole (PROTONIX) 40 MG tablet Take 1 tablet by mouth once daily (Patient not taking: Reported on 12/13/2021) 90 tablet 0  ? ?No facility-administered medications prior to visit.  ? ?Past Medical History:  ?Diagnosis Date  ? Allergy   ? SEASONAL  ? Arthritis   ? Phreesia 02/09/2020  ? Cataract   ? Bilateral  ? DDD (degenerative disc disease), cervical 04/2001  ? Endocervical polyp   ? pt unaware  ? Family history of anesthesia complication   ? Family history of PONV  ? Gastritis 05/27/2011  ? Noted on EGD  ? GERD (gastroesophageal reflux disease)   ? 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 Hiatal hernia 05/27/2011  ? Small, Noted on EGD  ? History of colon polyps 05/29/2015  ? Noted on colonscopy, pt unaware  ? Hyperlipidemia   ? Insomnia   ? Lipoma 07/30/2018  ? 1.8cmx1.5cmx9m, noted on CT neck  ?  Mitral regurgitation   ? patient denied 10/21/13  ? Multinodular thyroid 07/30/2018  ? Noted on CT neck  ? OA (osteoarthritis)   ? PONV (postoperative nausea and vomiting)   ? takes meds thry iv to prevent nausea  ? Pupil dilation   ? Right eye  ? Pupil irregular   ? right eye legally blind right eye  ? Restless leg syndrome   ? Wears partial dentures   ? upper  ? ?Past Surgical History:  ?Procedure Laterality Date  ? ANTERIOR LATERAL LUMBAR FUSION 4 LEVELS  07/19/2012  ? Procedure: ANTERIOR LATERAL LUMBAR FUSION 4 LEVELS;  Surgeon: RHosie Spangle MD;  [facility];  Service: Neurosurgery;  Laterality: N/A;  Lumbar one-two, lumbar two-three, lumbar three-four, lumbar four-five, lumbar five-sacral one anterolateral lumbar fusion with Lumbar one-sacral one posterior instrumentation  ? CATARACT EXTRACTION    ? bilateral  ? colonscopy  8 yrs ago  ? ESOPHAGOGASTRODUODENOSCOPY  05/27/2011  ? EYE SURGERY    ? HAMMER TOE SURGERY Bilateral   ? LIPOMA EXCISION N/A 11/14/2018  ? Procedure: EXCISION OF NECK LIPOMA;  Surgeon: Kinsinger, LArta Bruce MD;  [facility]  Service: General;  Laterality: N/A;  ? LUMBAR PERCUTANEOUS PEDICLE SCREW 3 LEVEL  07/19/2012  ? Procedure: LUMBAR PERCUTANEOUS PEDICLE SCREW 3 LEVEL;  Surgeon: RHosie Spangle MD;  [facility];  Service: Neurosurgery;  Laterality: Bilateral;  Lumbar one-two, lumbar two-three, lumbar three-four, lumbar four-five, lumbar five-sacral one anterolateral lumbar fusion with Lumbar one-sacral one posterior instrumentation  ? PUPILLOPLASTY Right   ? SHOULDER ARTHROSCOPY Left 10/22/2013  ? Procedure: ARTHROSCOPY SHOULDER;  Surgeon: Newt Minion, MD;  Location: [facility];  Service: Orthopedics;  Laterality: Left;  Left Shoulder Arthroscopy, Debridement, and Decompression, subacromial decompression and distal clavicle resection  ? SPINE SURGERY N/A   ? Phreesia 02/09/2020  ? THUMB FUSION    ? LEFT  ? ?Social History  ? ?Socioeconomic  History  ? Marital status: Single  ?  Spouse name: Not on file  ? Number of children: Not on file  ? Years of education: Not on file  ? Highest education level: Not on file  ?Occupational History  ? Occupation: Retired  ?  Comment: [employer]  ?Tobacco Use  ? Smoking status: Former  ?  Types: Cigarettes  ?  Quit date: 09/12/1989  ?  Years since quitting: 32.2  ? Smokeless tobacco: Never  ?Vaping Use  ? Vaping Use: Never used  ?Substance and Sexual Activity  ? Alcohol use: Yes  ?  Comment: social beer  ? Drug use: No  ? Sexual activity: Not on file  ?Other Topics Concern  ? Not on file  ?Social History Narrative  ? Daily caffeine use   ? ?Social Determinants of Health  ? ?Financial Resource Strain: Not on file  ?Food Insecurity: Not on file  ?Transportation Needs: Not on file  ?Physical Activity: Not on file  ?Stress: Not on file  ?Social Connections: Not on file  ? ?Family History  ?Problem Relation Age of Onset  ? Lung cancer Mother   ? Diabetes type II Mother   ? Heart disease Father   ? Colon polyps Sister   ?     adenomatous  ? Hypertension Sister   ? Diabetes type II Sister   ? Hypertension Brother   ? Colon cancer Neg Hx   ? Breast cancer Neg Hx   ? Esophageal cancer Neg Hx   ? Stomach cancer Neg Hx   ? Pancreatic cancer Neg Hx   ? ?   ? ?Review of Systems: Pertinent positive and negative review of systems were noted in the above HPI section. All other review of systems were otherwise negative. ? ? ? ?Physical Exam: ?General: Well developed, well nourished, no acute distress ?Head: Normocephalic and atraumatic ?Eyes: Sclerae anicteric, EOMI ?Ears: Normal auditory acuity ?Mouth: Not examined, mask on during Covid-19 pandemic ?Neck: Supple, no masses or thyromegaly ?Lungs: Clear throughout to auscultation ?Heart: Regular rate and rhythm; no murmurs, rubs or bruits ?Abdomen: Soft, non tender and non distended. No masses, hepatosplenomegaly or hernias noted. Normal Bowel sounds ?Rectal: Deferred  to colonoscopy  ?Musculoskeletal: Symmetrical with no gross deformities  ?Skin: No lesions on visible extremities ?Pulses:  Normal pulses noted ?Extremities: No clubbing, cyanosis, edema or deformities noted ?Neurological: Alert oriented x 4, grossly nonfocal ?Cervical Nodes:  No significant cervical adenopathy ?Inguinal Nodes: No significant inguinal adenopathy ?Psychological:  Alert and cooperative. Normal mood and affect ? ? ?Assessment and Recommendations: ? ?Family history of colon polyps, brother and sister.  Schedule colonoscopy. The risks (including bleeding, perforation, infection, missed lesions, medication reactions and possible hospitalization or surgery if complications occur), benefits, and alternatives to colonoscopy with possible biopsy and possible polypectomy were discussed with the patient and they consent to proceed.   ?GERD, chronic.  Symptoms under  good control however she has frequent breakthrough symptoms.  Closely follow antireflux measures.  Take pantoprazole 40 mg p.o. twice daily 30 minutes before breakfast and dinner.  Consider adding famotidine 40 mg at bedtime pending findings on EGD. The risks (including bleeding, perforation, infection, missed lesions, medication reactions and possible hospitalization or surgery if complications occur), benefits, and alternatives to endoscopy with possible biopsy and possible dilation were discussed with the patient and they consent to proceed.   ? ? ?cc: Horald Pollen, MD ?[address] ?[place],  [place] [address] ?

## 2021-12-13 NOTE — Patient Instructions (Signed)
You have been scheduled for an endoscopy and colonoscopy. Please follow the written instructions given to you at your visit today. ?Please pick up your prep supplies at the pharmacy within the next 1-3 days. ?If you use inhalers (even only as needed), please bring them with you on the day of your procedure. ? ?Patient advised to avoid spicy, acidic, citrus, chocolate, mints, fruit and fruit juices.  Limit the intake of caffeine, alcohol and Soda.  Don't exercise too soon after eating.  Don't lie down within 3-4 hours of eating.  Elevate the head of your bed. ? ?The [facility] providers would like to encourage you to use [facility] to communicate with providers for non-urgent requests or questions.  Due to long hold times on the telephone, sending your provider a message by [facility] may be a faster and more efficient way to get a response.  Please allow 48 business hours for a response.  Please remember that this is for non-urgent requests.  ? ?Due to recent changes in healthcare laws, you may see the results of your imaging and laboratory studies on MyChart before your provider has had a chance to review them.  We understand that in some cases there may be results that are confusing or concerning to you. Not all laboratory results come back in the same time frame and the provider may be waiting for multiple results in order to interpret others.  Please give [place] 48 hours in order for your provider to thoroughly review all the results before contacting the office for clarification of your results.  ? ?Thank you for choosing me and [facility]. ? ?Malcolm T. Dagoberto Ligas., MD., [facility] ? ?

## 2021-12-14 ENCOUNTER — Ambulatory Visit (INDEPENDENT_AMBULATORY_CARE_PROVIDER_SITE_OTHER): Payer: PPO

## 2021-12-14 ENCOUNTER — Telehealth: Payer: Self-pay | Admitting: Emergency Medicine

## 2021-12-14 ENCOUNTER — Ambulatory Visit (INDEPENDENT_AMBULATORY_CARE_PROVIDER_SITE_OTHER): Payer: PPO | Admitting: Emergency Medicine

## 2021-12-14 ENCOUNTER — Encounter: Payer: Self-pay | Admitting: Emergency Medicine

## 2021-12-14 VITALS — BP 130/84 | HR 67 | Temp 98.5°F | Ht 67.0 in | Wt 160.1 lb

## 2021-12-14 DIAGNOSIS — Z13228 Encounter for screening for other metabolic disorders: Secondary | ICD-10-CM

## 2021-12-14 DIAGNOSIS — K2100 Gastro-esophageal reflux disease with esophagitis, without bleeding: Secondary | ICD-10-CM | POA: Diagnosis not present

## 2021-12-14 DIAGNOSIS — G8929 Other chronic pain: Secondary | ICD-10-CM | POA: Diagnosis not present

## 2021-12-14 DIAGNOSIS — Z13220 Encounter for screening for lipoid disorders: Secondary | ICD-10-CM

## 2021-12-14 DIAGNOSIS — Z1329 Encounter for screening for other suspected endocrine disorder: Secondary | ICD-10-CM | POA: Diagnosis not present

## 2021-12-14 DIAGNOSIS — M159 Polyosteoarthritis, unspecified: Secondary | ICD-10-CM

## 2021-12-14 DIAGNOSIS — M1990 Unspecified osteoarthritis, unspecified site: Secondary | ICD-10-CM

## 2021-12-14 DIAGNOSIS — M25551 Pain in right hip: Secondary | ICD-10-CM

## 2021-12-14 DIAGNOSIS — Z1321 Encounter for screening for nutritional disorder: Secondary | ICD-10-CM | POA: Diagnosis not present

## 2021-12-14 DIAGNOSIS — Z130 Encounter for screening for diseases of the blood and blood-forming organs and certain disorders involving the immune mechanism: Secondary | ICD-10-CM

## 2021-12-14 LAB — COMPREHENSIVE METABOLIC PANEL
ALT: 27 U/L (ref 0–35)
AST: 39 U/L — ABNORMAL HIGH (ref 0–37)
Albumin: 4.4 g/dL (ref 3.5–5.2)
Alkaline Phosphatase: 47 U/L (ref 39–117)
BUN: 19 mg/dL (ref 6–23)
CO2: 31 mEq/L (ref 19–32)
Calcium: 9.8 mg/dL (ref 8.4–10.5)
Chloride: 102 mEq/L (ref 96–112)
Creatinine, Ser: 0.82 mg/dL (ref 0.40–1.20)
GFR: 72.04 mL/min (ref >60.00)
Glucose, Bld: 103 mg/dL — ABNORMAL HIGH (ref 70–99)
Potassium: 4.8 mEq/L (ref 3.5–5.1)
Sodium: 139 mEq/L (ref 135–145)
Total Bilirubin: 0.7 mg/dL (ref 0.2–1.2)
Total Protein: 7.2 g/dL (ref 6.0–8.3)

## 2021-12-14 LAB — CBC WITH DIFFERENTIAL/PLATELET
Basophils Absolute: 0 10*3/uL (ref 0.0–0.1)
Basophils Relative: 0.8 % (ref 0.0–3.0)
Eosinophils Absolute: 0.1 10*3/uL (ref 0.0–0.7)
Eosinophils Relative: 3.1 % (ref 0.0–5.0)
HCT: 43 % (ref 36.0–46.0)
Hemoglobin: 14.6 g/dL (ref 12.0–15.0)
Lymphocytes Relative: 26.6 % (ref 12.0–46.0)
Lymphs Abs: 1.1 10*3/uL (ref 0.7–4.0)
MCHC: 33.9 g/dL (ref 30.0–36.0)
MCV: 89 fl (ref 78.0–100.0)
Monocytes Absolute: 0.3 10*3/uL (ref 0.1–1.0)
Monocytes Relative: 7.7 % (ref 3.0–12.0)
Neutro Abs: 2.6 10*3/uL (ref 1.4–7.7)
Neutrophils Relative %: 61.8 % (ref 43.0–77.0)
Platelets: 269 10*3/uL (ref 150.0–400.0)
RBC: 4.83 Mil/uL (ref 3.87–5.11)
RDW: 13.4 % (ref 11.5–15.5)
WBC: 4.2 10*3/uL (ref 4.0–10.5)

## 2021-12-14 LAB — LIPID PANEL
Cholesterol: 265 mg/dL — ABNORMAL HIGH (ref 0–200)
HDL: 82 mg/dL (ref >39.00)
LDL Cholesterol: 166 mg/dL — ABNORMAL HIGH (ref 0–99)
NonHDL: 183.29
Total CHOL/HDL Ratio: 3
Triglycerides: 87 mg/dL (ref 0.0–149.0)
VLDL: 17.4 mg/dL (ref 0.0–40.0)

## 2021-12-14 LAB — HEMOGLOBIN A1C: Hgb A1c MFr Bld: 5.9 % (ref 4.6–6.5)

## 2021-12-14 MED ORDER — NAPROXEN 500 MG PO TABS: ORAL_TABLET | ORAL | 1 refills | Status: DC

## 2021-12-14 MED ORDER — PANTOPRAZOLE SODIUM 40 MG PO TBEC: 40.0000 mg | DELAYED_RELEASE_TABLET | Freq: Two times a day (BID) | ORAL | 1 refills | Status: DC

## 2021-12-14 NOTE — Assessment & Plan Note (Addendum)
Chronic and affecting quality of life.  However still able to walk about 4 miles daily. ?Unremarkable physical exam. ?X-rays done today.  May need orthopedic referral. ?

## 2021-12-14 NOTE — Telephone Encounter (Signed)
Pt inquiring if provider can prescribe a 90 day supply of naproxen (NAPROSYN) 500 MG tablet ? ? ?Please advise ?

## 2021-12-14 NOTE — Progress Notes (Signed)
Rachel Boone ?71 y.o. ? ? ?Chief Complaint  ?Patient presents with  ? Follow-up  ? rt hip pain   ? ? ?HISTORY OF PRESENT ILLNESS: ?This is a 71 y.o. female complaining of pain to the right hip on and off for the past year. ?Mostly at night.  Wakes her up at night. ?Walks about 4 miles every day. ?Has history of osteoarthritis.  Takes naproxen every day. ?No other complaints or medical concerns. ? ?HPI ? ? ?Prior to Admission medications   ?Medication Sig Start Date End Date Taking? Authorizing Provider  ?magnesium oxide (MAG-OX) 400 MG tablet Take 400 mg by mouth daily.   Yes [provider]  ?naproxen (NAPROSYN) 500 MG tablet TAKE 1 TABLET BY MOUTH TWICE DAILY AS NEEDED FOR MODERATE PAIN 07/18/21  Yes Devynn Hessler, Ines Bloomer, MD  ?pantoprazole (PROTONIX) 40 MG tablet Take 40 mg by mouth 2 (two) times daily.   Yes [provider]  ?polyethylene glycol (MIRALAX / GLYCOLAX) 17 g packet Take 17 g by mouth daily.   Yes [provider]  ? ? ?No Known Allergies ? ?Patient Active Problem List  ? Diagnosis Date Noted  ? Multinodular goiter 09/10/2018  ? Gastro-esophageal reflux 05/23/2014  ? Arthritis 05/23/2014  ? ? ?Past Medical History:  ?Diagnosis Date  ? Allergy   ? SEASONAL  ? Arthritis   ? Phreesia 02/09/2020  ? Cataract   ? Bilateral  ? DDD (degenerative disc disease), cervical 04/2001  ? Endocervical polyp   ? pt unaware  ? Family history of anesthesia complication   ? Family history of PONV  ? Gastritis 05/27/2011  ? Noted on EGD  ? GERD (gastroesophageal reflux disease)   ? 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 Hiatal hernia 05/27/2011  ? Small, Noted on EGD  ? History of colon polyps 05/29/2015  ? Noted on colonscopy, pt unaware  ? Hyperlipidemia   ? Insomnia   ? Lipoma 07/30/2018  ? 1.8cmx1.5cmx6m, noted on CT neck  ? Mitral regurgitation   ? patient denied 10/21/13  ? Multinodular thyroid 07/30/2018  ? Noted on CT neck  ? OA  (osteoarthritis)   ? PONV (postoperative nausea and vomiting)   ? takes meds thry iv to prevent nausea  ? Pupil dilation   ? Right eye  ? Pupil irregular   ? right eye legally blind right eye  ? Restless leg syndrome   ? Wears partial dentures   ? upper  ? ? ?Past Surgical History:  ?Procedure Laterality Date  ? ANTERIOR LATERAL LUMBAR FUSION 4 LEVELS  07/19/2012  ? Procedure: ANTERIOR LATERAL LUMBAR FUSION 4 LEVELS;  Surgeon: RHosie Spangle MD;  [facility];  Service: Neurosurgery;  Laterality: N/A;  Lumbar one-two, lumbar two-three, lumbar three-four, lumbar four-five, lumbar five-sacral one anterolateral lumbar fusion with Lumbar one-sacral one posterior instrumentation  ? CATARACT EXTRACTION    ? bilateral  ? colonscopy  8 yrs ago  ? ESOPHAGOGASTRODUODENOSCOPY  05/27/2011  ? EYE SURGERY    ? HAMMER TOE SURGERY Bilateral   ? LIPOMA EXCISION N/A 11/14/2018  ? Procedure: EXCISION OF NECK LIPOMA;  Surgeon: Kinsinger, LArta Bruce MD;  [facility]  Service: General;  Laterality: N/A;  ? LUMBAR PERCUTANEOUS PEDICLE SCREW 3 LEVEL  07/19/2012  ? Procedure: LUMBAR PERCUTANEOUS PEDICLE SCREW 3 LEVEL;  Surgeon: RHosie Spangle MD;  [facility];  Service:  Neurosurgery;  Laterality: Bilateral;  Lumbar one-two, lumbar two-three, lumbar three-four, lumbar four-five, lumbar five-sacral one anterolateral lumbar fusion with Lumbar one-sacral one posterior instrumentation  ? PUPILLOPLASTY Right   ? SHOULDER ARTHROSCOPY Left 10/22/2013  ? Procedure: ARTHROSCOPY SHOULDER;  Surgeon: Newt Minion, MD;  Location: [facility];  Service: Orthopedics;  Laterality: Left;  Left Shoulder Arthroscopy, Debridement, and Decompression, subacromial decompression and distal clavicle resection  ? SPINE SURGERY N/A   ? Phreesia 02/09/2020  ? THUMB FUSION    ? LEFT  ? ? ?Social History  ? ?Socioeconomic History  ? Marital status: Single  ?  Spouse name: Not on file  ? Number of children: Not on file  ?  Years of education: Not on file  ? Highest education level: Not on file  ?Occupational History  ? Occupation: Retired  ?  Comment: [employer]  ?Tobacco Use  ? Smoking status: Former  ?  Types: Cigarettes  ?  Quit date: 09/12/1989  ?  Years since quitting: 32.2  ? Smokeless tobacco: Never  ?Vaping Use  ? Vaping Use: Never used  ?Substance and Sexual Activity  ? Alcohol use: Yes  ?  Comment: social beer  ? Drug use: No  ? Sexual activity: Not on file  ?Other Topics Concern  ? Not on file  ?Social History Narrative  ? Daily caffeine use   ? ?Social Determinants of Health  ? ?Financial Resource Strain: Not on file  ?Food Insecurity: Not on file  ?Transportation Needs: Not on file  ?Physical Activity: Not on file  ?Stress: Not on file  ?Social Connections: Not on file  ?Intimate Partner Violence: Not on file  ? ? ?Family History  ?Problem Relation Age of Onset  ? Lung cancer Mother   ? Diabetes type II Mother   ? Heart disease Father   ? Colon polyps Sister   ?     adenomatous  ? Hypertension Sister   ? Diabetes type II Sister   ? Hypertension Brother   ? Colon cancer Neg Hx   ? Breast cancer Neg Hx   ? Esophageal cancer Neg Hx   ? Stomach cancer Neg Hx   ? Pancreatic cancer Neg Hx   ? ? ? ?Review of Systems  ?Constitutional: Negative.  Negative for chills and fever.  ?HENT: Negative.  Negative for congestion and sore throat.   ?Respiratory: Negative.  Negative for cough and shortness of breath.   ?Cardiovascular: Negative.  Negative for chest pain and palpitations.  ?Gastrointestinal: Negative.  Negative for abdominal pain, nausea and vomiting.  ?Genitourinary: Negative.  Negative for dysuria and hematuria.  ?Musculoskeletal:  Positive for joint pain.  ?Skin: Negative.  Negative for rash.  ?Neurological: Negative.  Negative for dizziness and headaches.  ?All other systems reviewed and are negative. ? ?Today's Vitals  ? 12/14/21 0924  ?BP: 130/84  ?Pulse: 67  ?Temp: 98.5 ?F (36.9 ?C)  ?TempSrc: Oral   ?SpO2: 98%  ?Weight: 160 lb 2 oz (72.6 kg)  ?Height: '5\' 7"'$  (1.702 m)  ? ?Body mass index is 25.08 kg/m?. ? ?Physical Exam ?Vitals reviewed.  ?Constitutional:   ?   Appearance: Normal appearance.  ?HENT:  ?   Head: Normocephalic.  ?   Mouth/Throat:  ?   Mouth: Mucous membranes are moist.  ?   Pharynx: Oropharynx is clear.  ?   Comments: Hard growths to sublingual areas left more than right.  Scheduled to see dental surgeon in the next few  weeks. ?Eyes:  ?   Extraocular Movements: Extraocular movements intact.  ?   Conjunctiva/sclera: Conjunctivae normal.  ?   Pupils: Pupils are equal, round, and reactive to light.  ?Cardiovascular:  ?   Rate and Rhythm: Normal rate and regular rhythm.  ?   Pulses: Normal pulses.  ?   Heart sounds: Normal heart sounds.  ?Pulmonary:  ?   Effort: Pulmonary effort is normal.  ?   Breath sounds: Normal breath sounds.  ?Abdominal:  ?   Palpations: Abdomen is soft.  ?   Tenderness: There is no abdominal tenderness.  ?Musculoskeletal:  ?   Cervical back: No tenderness.  ?   Comments: Hips: Full range of motion bilaterally.  No localized tenderness. ?Lower extremities: Within normal limits.  No abnormal findings.  ?Lymphadenopathy:  ?   Cervical: No cervical adenopathy.  ?Skin: ?   General: Skin is warm and dry.  ?Neurological:  ?   General: No focal deficit present.  ?   Mental Status: She is alert and oriented to person, place, and time.  ?Psychiatric:     ?   Mood and Affect: Mood normal.     ?   Behavior: Behavior normal.  ? ?DG HIP UNILAT W OR W/O PELVIS 2-3 VIEWS RIGHT ? ?Result Date: 12/14/2021 ?CLINICAL DATA:  Chronic right hip pain over the last year EXAM: DG HIP (WITH OR WITHOUT PELVIS) 2-3V RIGHT COMPARISON:  None. FINDINGS: The right hip joint appears normal. No joint space narrowing or osteophyte formation. No sign of avascular necrosis. There is mild osteoarthritis of the left hip with joint space narrowing and osteophyte formation. Sacroiliac joints and symphysis pubis appear  normal. No other focal bone finding. IMPRESSION: Right hip appears normal. Mild osteoarthritis of the left hip incidentally noted. Electronically Signed   By: Nelson Chimes M.D.   On: 12/14/2021 10:08   ? ? ?ASSESSMENT & PL

## 2021-12-14 NOTE — Assessment & Plan Note (Signed)
Stable.  Continue pantoprazole 40 mg twice a day. ?Diet and nutrition discussed. ?

## 2021-12-14 NOTE — Assessment & Plan Note (Signed)
Stable.  Takes naproxen almost on a daily basis. ?We will check renal function test today. ?Advised to stay well-hydrated. ?

## 2021-12-14 NOTE — Telephone Encounter (Signed)
Patient notified

## 2021-12-14 NOTE — Patient Instructions (Signed)
Hip Pain The hip is the joint between the upper legs and the lower pelvis. The bones, cartilage, tendons, and muscles of your hip joint support your body and allow you to move around. Hip pain can range from a minor ache to severe pain in one or both of your hips. The pain may be felt on the inside of the hip joint near the groin, or on the outside near the buttocks and upper thigh. You may also have swelling or stiffness in your hip area. Follow these instructions at home: Managing pain, stiffness, and swelling   If directed, put ice on the painful area. To do this: Put ice in a plastic bag. Place a towel between your skin and the bag. Leave the ice on for 20 minutes, 2-3 times a day. If directed, apply heat to the affected area as often as told by your health care provider. Use the heat source that your health care provider recommends, such as a moist heat pack or a heating pad. Place a towel between your skin and the heat source. Leave the heat on for 20-30 minutes. Remove the heat if your skin turns bright red. This is especially important if you are unable to feel pain, heat, or cold. You may have a greater risk of getting burned. Activity Do exercises as told by your health care provider. Avoid activities that cause pain. General instructions  Take over-the-counter and prescription medicines only as told by your health care provider. Keep a journal of your symptoms. Write down: How often you have hip pain. The location of your pain. What the pain feels like. What makes the pain worse. Sleep with a pillow between your legs on your most comfortable side. Keep all follow-up visits as told by your health care provider. This is important. Contact a health care provider if: You cannot put weight on your leg. Your pain or swelling continues or gets worse after one week. It gets harder to walk. You have a fever. Get help right away if: You fall. You have a sudden increase in pain and  swelling in your hip. Your hip is red or swollen or very tender to touch. Summary Hip pain can range from a minor ache to severe pain in one or both of your hips. The pain may be felt on the inside of the hip joint near the groin, or on the outside near the buttocks and upper thigh. Avoid activities that cause pain. Write down how often you have hip pain, the location of the pain, what makes it worse, and what it feels like. This information is not intended to replace advice given to you by your health care provider. Make sure you discuss any questions you have with your health care provider. Document Revised: 01/14/2019 Document Reviewed: 01/14/2019 Elsevier Patient Education  2022 Elsevier Inc.  

## 2021-12-15 ENCOUNTER — Encounter: Payer: Self-pay | Admitting: Emergency Medicine

## 2021-12-15 ENCOUNTER — Other Ambulatory Visit: Payer: Self-pay | Admitting: Emergency Medicine

## 2021-12-15 DIAGNOSIS — E785 Hyperlipidemia, unspecified: Secondary | ICD-10-CM

## 2021-12-15 MED ORDER — ROSUVASTATIN CALCIUM 10 MG PO TABS: 10.0000 mg | ORAL_TABLET | Freq: Every day | ORAL | 3 refills | Status: DC

## 2021-12-15 NOTE — Progress Notes (Signed)
Abnormal lipid profile. ?Lab Results  ?Component Value Date  ? CHOL 265 (H) 12/14/2021  ? HDL 82.00 12/14/2021  ? LDLCALC 166 (H) 12/14/2021  ? TRIG 87.0 12/14/2021  ? CHOLHDL 3 12/14/2021  ? ?The 10-year ASCVD risk score (Arnett DK, et al., 2019) is: 11% ?  Values used to calculate the score: ?    Age: 71 years ?    Sex: Female ?    Is Non-Hispanic African American: No ?    Diabetic: No ?    Tobacco smoker: No ?    Systolic Blood Pressure: 876 mmHg ?    Is BP treated: No ?    HDL Cholesterol: 82 mg/dL ?    Total Cholesterol: 265 mg/dL ?Recommend to start rosuvastatin 10 mg daily. ?

## 2021-12-16 NOTE — Telephone Encounter (Signed)
Her body, her decision.  I do not want people taking medications that they do not want to take as long as they understand the reasoning behind it.  Her total cholesterol and LDL cholesterol came back elevated and they have been for a while. ?The 10-year ASCVD risk score (Arnett DK, et al., 2019) is: 11% ?  Values used to calculate the score: ?    Age: 71 years ?    Sex: Female ?    Is Non-Hispanic African American: No ?    Diabetic: No ?    Tobacco smoker: No ?    Systolic Blood Pressure: 829 mmHg ?    Is BP treated: No ?    HDL Cholesterol: 82 mg/dL ?    Total Cholesterol: 265 mg/dL ?I am okay with what ever decision she makes.  Thanks.

## 2022-01-10 ENCOUNTER — Other Ambulatory Visit: Payer: Self-pay | Admitting: Emergency Medicine

## 2022-01-10 DIAGNOSIS — N6489 Other specified disorders of breast: Secondary | ICD-10-CM

## 2022-01-10 DIAGNOSIS — Z09 Encounter for follow-up examination after completed treatment for conditions other than malignant neoplasm: Secondary | ICD-10-CM

## 2022-01-24 ENCOUNTER — Encounter: Payer: Self-pay | Admitting: Gastroenterology

## 2022-01-24 ENCOUNTER — Ambulatory Visit (AMBULATORY_SURGERY_CENTER): Payer: PPO | Admitting: Gastroenterology

## 2022-01-24 VITALS — BP 160/85 | HR 64 | Temp 98.6°F | Resp 11 | Ht 67.0 in | Wt 162.0 lb

## 2022-01-24 DIAGNOSIS — D125 Benign neoplasm of sigmoid colon: Secondary | ICD-10-CM | POA: Diagnosis not present

## 2022-01-24 DIAGNOSIS — K2100 Gastro-esophageal reflux disease with esophagitis, without bleeding: Secondary | ICD-10-CM

## 2022-01-24 DIAGNOSIS — K449 Diaphragmatic hernia without obstruction or gangrene: Secondary | ICD-10-CM

## 2022-01-24 DIAGNOSIS — K259 Gastric ulcer, unspecified as acute or chronic, without hemorrhage or perforation: Secondary | ICD-10-CM | POA: Diagnosis not present

## 2022-01-24 DIAGNOSIS — Z1211 Encounter for screening for malignant neoplasm of colon: Secondary | ICD-10-CM

## 2022-01-24 DIAGNOSIS — G2581 Restless legs syndrome: Secondary | ICD-10-CM | POA: Diagnosis not present

## 2022-01-24 DIAGNOSIS — K317 Polyp of stomach and duodenum: Secondary | ICD-10-CM | POA: Diagnosis not present

## 2022-01-24 DIAGNOSIS — K219 Gastro-esophageal reflux disease without esophagitis: Secondary | ICD-10-CM | POA: Diagnosis not present

## 2022-01-24 DIAGNOSIS — E785 Hyperlipidemia, unspecified: Secondary | ICD-10-CM | POA: Diagnosis not present

## 2022-01-24 DIAGNOSIS — Z8371 Family history of colonic polyps: Secondary | ICD-10-CM | POA: Diagnosis not present

## 2022-01-24 DIAGNOSIS — K635 Polyp of colon: Secondary | ICD-10-CM | POA: Diagnosis not present

## 2022-01-24 DIAGNOSIS — K3189 Other diseases of stomach and duodenum: Secondary | ICD-10-CM | POA: Diagnosis not present

## 2022-01-24 MED ORDER — PANTOPRAZOLE SODIUM 40 MG PO TBEC: 40.0000 mg | DELAYED_RELEASE_TABLET | Freq: Two times a day (BID) | ORAL | 2 refills | Status: DC

## 2022-01-24 MED ORDER — SODIUM CHLORIDE 0.9 % IV SOLN: 500.0000 mL | Freq: Once | INTRAVENOUS | Status: DC

## 2022-01-24 MED ORDER — FAMOTIDINE 40 MG PO TABS: 40.0000 mg | ORAL_TABLET | Freq: Every day | ORAL | 2 refills | Status: DC

## 2022-01-24 NOTE — Op Note (Signed)
[facility] ?Patient Name: Rachel Boone ?Procedure Date: 01/24/2022 2:46 PM ?MRN: [id number] ?Endoscopist: Ladene Artist , MD ?Age: 71 ?Referring MD:  ?Date of Birth: 22-Jan-1951 ?Gender: Female ?Account #: [id number] ?Procedure:                Colonoscopy ?Indications:              Colon cancer screening in patient at increased  ?                          risk: Family history of colon polyps in multiple  ?                          1st-degree relatives ?Medicines:                Monitored Anesthesia Care ?Procedure:                Pre-Anesthesia Assessment: ?                          - Prior to the procedure, a History and Physical  ?                          was performed, and patient medications and  ?                          allergies were reviewed. The patient's tolerance of  ?                          previous anesthesia was also reviewed. The risks  ?                          and benefits of the procedure and the sedation  ?                          options and risks were discussed with the patient.  ?                          All questions were answered, and informed consent  ?                          was obtained. Prior Anticoagulants: The patient has  ?                          taken no previous anticoagulant or antiplatelet  ?                          agents. ASA Grade Assessment: II - A patient with  ?                          mild systemic disease. After reviewing the risks  ?                          and benefits, the patient was deemed in  ?  satisfactory condition to undergo the procedure. ?                          After obtaining informed consent, the colonoscope  ?                          was passed under direct vision. Throughout the  ?                          procedure, the patient's blood pressure, pulse, and  ?                          oxygen saturations were monitored continuously. The  ?                          PCF-HQ190L Colonoscope was introduced  through the  ?                          anus and advanced to the the cecum, identified by  ?                          appendiceal orifice and ileocecal valve. The  ?                          ileocecal valve, appendiceal orifice, and rectum  ?                          were photographed. The quality of the bowel  ?                          preparation was good. The colonoscopy was performed  ?                          without difficulty. The patient tolerated the  ?                          procedure well. ?Scope In: 2:50:15 PM ?Scope Out: 3:08:55 PM ?Scope Withdrawal Time: 0 hours 13 minutes 16 seconds  ?Total Procedure Duration: 0 hours 18 minutes 40 seconds  ?Findings:                 The perianal and digital rectal examinations were  ?                          normal. ?                          A 5 mm polyp was found in the sigmoid colon. The  ?                          polyp was sessile. The polyp was removed with a  ?                          cold snare. Resection and retrieval were complete. ?  Internal hemorrhoids were found during  ?                          retroflexion. The hemorrhoids were small and Grade  ?                          I (internal hemorrhoids that do not prolapse). ?                          The exam was otherwise without abnormality on  ?                          direct and retroflexion views. ?Complications:            No immediate complications. Estimated blood loss:  ?                          None. ?Estimated Blood Loss:     Estimated blood loss: none. ?Impression:               - One 5 mm polyp in the sigmoid colon, removed with  ?                          a cold snare. Resected and retrieved. ?                          - Internal hemorrhoids. ?                          - The examination was otherwise normal on direct  ?                          and retroflexion views. ?Recommendation:           - Repeat colonoscopy, likely 5 years, after studies  ?                           are complete for surveillance based on pathology  ?                          results. ?                          - Patient has a contact number available for  ?                          emergencies. The signs and symptoms of potential  ?                          delayed complications were discussed with the  ?                          patient. Return to normal activities tomorrow.  ?                          Written discharge instructions were provided to the  ?  patient. ?                          - Resume previous diet. ?                          - Continue present medications. ?                          - Await pathology results. ?Ladene Artist, MD ?01/24/2022 3:19:37 PM ?This report has been signed electronically. ?

## 2022-01-24 NOTE — Progress Notes (Signed)
? ?History & Physical ? ?Primary Care Physician:  Horald Pollen, MD ?Primary Gastroenterologist: Lucio Edward, MD ? ?CHIEF COMPLAINT:  GERD, Family history of colon polyps  ? ?HPI: Rachel Boone is a 71 y.o. female with a history of GERD and family history of colon polyps (brother and sister) here today for EGD and colonoscopy. ? ? ?Past Medical History:  ?Diagnosis Date  ? Allergy   ? SEASONAL  ? Arthritis   ? Phreesia 02/09/2020  ? Cataract   ? Bilateral  ? DDD (degenerative disc disease), cervical 04/2001  ? Endocervical polyp   ? pt unaware  ? Family history of anesthesia complication   ? Family history of PONV  ? Gastritis 05/27/2011  ? Noted on EGD  ? GERD (gastroesophageal reflux disease)   ? 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 Hiatal hernia 05/27/2011  ? Small, Noted on EGD  ? History of colon polyps 05/29/2015  ? Noted on colonscopy, pt unaware  ? Hyperlipidemia   ? Insomnia   ? Lipoma 07/30/2018  ? 1.8cmx1.5cmx44m, noted on CT neck  ? Mitral regurgitation   ? patient denied 10/21/13  ? Multinodular thyroid 07/30/2018  ? Noted on CT neck  ? OA (osteoarthritis)   ? PONV (postoperative nausea and vomiting)   ? takes meds thry iv to prevent nausea  ? Pupil dilation   ? Right eye  ? Pupil irregular   ? right eye legally blind right eye  ? Restless leg syndrome   ? Wears partial dentures   ? upper  ? ? ?Past Surgical History:  ?Procedure Laterality Date  ? ANTERIOR LATERAL LUMBAR FUSION 4 LEVELS  07/19/2012  ? Procedure: ANTERIOR LATERAL LUMBAR FUSION 4 LEVELS;  Surgeon: RHosie Spangle MD;  [facility];  Service: Neurosurgery;  Laterality: N/A;  Lumbar one-two, lumbar two-three, lumbar three-four, lumbar four-five, lumbar five-sacral one anterolateral lumbar fusion with Lumbar one-sacral one posterior instrumentation  ? CATARACT EXTRACTION    ? bilateral  ? colonscopy  8 yrs ago  ? ESOPHAGOGASTRODUODENOSCOPY  05/27/2011  ? EYE SURGERY    ?  HAMMER TOE SURGERY Bilateral   ? LIPOMA EXCISION N/A 11/14/2018  ? Procedure: EXCISION OF NECK LIPOMA;  Surgeon: Kinsinger, LArta Bruce MD;  [facility]  Service: General;  Laterality: N/A;  ? LUMBAR PERCUTANEOUS PEDICLE SCREW 3 LEVEL  07/19/2012  ? Procedure: LUMBAR PERCUTANEOUS PEDICLE SCREW 3 LEVEL;  Surgeon: RHosie Spangle MD;  [facility];  Service: Neurosurgery;  Laterality: Bilateral;  Lumbar one-two, lumbar two-three, lumbar three-four, lumbar four-five, lumbar five-sacral one anterolateral lumbar fusion with Lumbar one-sacral one posterior instrumentation  ? PUPILLOPLASTY Right   ? SHOULDER ARTHROSCOPY Left 10/22/2013  ? Procedure: ARTHROSCOPY SHOULDER;  Surgeon: MNewt Minion MD;  [facility]  Service: Orthopedics;  Laterality: Left;  Left Shoulder Arthroscopy, Debridement, and Decompression, subacromial decompression and distal clavicle resection  ? SPINE SURGERY N/A   ? Phreesia 02/09/2020  ? THUMB FUSION    ? LEFT  ? ? ?Prior to Admission medications   ?Medication Sig Start Date End Date Taking? Authorizing Provider  ?magnesium oxide (MAG-OX) 400 MG tablet Take 400 mg by mouth daily.   Yes [provider]  ?naproxen (NAPROSYN) 500 MG tablet TAKE 1 TABLET BY MOUTH TWICE DAILY AS NEEDED FOR MODERATE PAIN 12/14/21  Yes SHorald Pollen MD  ?pantoprazole (PROTONIX) 40 MG  tablet Take 1 tablet (40 mg total) by mouth 2 (two) times daily. 12/14/21 03/14/22 Yes Sagardia, Ines Bloomer, MD  ?polyethylene glycol (MIRALAX / GLYCOLAX) 17 g packet Take 17 g by mouth daily.   Yes [provider]  ?rosuvastatin (CRESTOR) 10 MG tablet Take 1 tablet (10 mg total) by mouth daily. 12/15/21   Horald Pollen, MD  ? ? ?Current Outpatient Medications  ?Medication Sig Dispense Refill  ? magnesium oxide (MAG-OX) 400 MG tablet Take 400 mg by mouth daily.    ? naproxen (NAPROSYN) 500 MG tablet TAKE 1 TABLET BY MOUTH TWICE DAILY AS NEEDED FOR MODERATE PAIN 180 tablet 1   ? pantoprazole (PROTONIX) 40 MG tablet Take 1 tablet (40 mg total) by mouth 2 (two) times daily. 180 tablet 1  ? polyethylene glycol (MIRALAX / GLYCOLAX) 17 g packet Take 17 g by mouth daily.    ? rosuvastatin (CRESTOR) 10 MG tablet Take 1 tablet (10 mg total) by mouth daily. 90 tablet 3  ? ?Current Facility-Administered Medications  ?Medication Dose Route Frequency Provider Last Rate Last Admin  ? 0.9 %  sodium chloride infusion  500 mL Intravenous Once Ladene Artist, MD      ? ? ?Allergies as of 01/24/2022  ? (No Known Allergies)  ? ? ?Family History  ?Problem Relation Age of Onset  ? Lung cancer Mother   ? Diabetes type II Mother   ? Heart disease Father   ? Colon polyps Sister   ?     adenomatous  ? Hypertension Sister   ? Diabetes type II Sister   ? Hypertension Brother   ? Colon cancer Neg Hx   ? Breast cancer Neg Hx   ? Esophageal cancer Neg Hx   ? Stomach cancer Neg Hx   ? Pancreatic cancer Neg Hx   ? ? ?Social History  ? ?Socioeconomic History  ? Marital status: Single  ?  Spouse name: Not on file  ? Number of children: Not on file  ? Years of education: Not on file  ? Highest education level: Not on file  ?Occupational History  ? Occupation: Retired  ?  Comment: [employer]  ?Tobacco Use  ? Smoking status: Former  ?  Types: Cigarettes  ?  Quit date: 09/12/1989  ?  Years since quitting: 32.3  ? Smokeless tobacco: Never  ?Vaping Use  ? Vaping Use: Never used  ?Substance and Sexual Activity  ? Alcohol use: Yes  ?  Comment: social beer  ? Drug use: No  ? Sexual activity: Not on file  ?Other Topics Concern  ? Not on file  ?Social History Narrative  ? Daily caffeine use   ? ?Social Determinants of Health  ? ?Financial Resource Strain: Not on file  ?Food Insecurity: Not on file  ?Transportation Needs: Not on file  ?Physical Activity: Not on file  ?Stress: Not on file  ?Social Connections: Not on file  ?Intimate Partner Violence: Not on file  ? ? ?Review of Systems: ? ?All systems reviewed an  negative except where noted in HPI. ? ?Gen: Denies any fever, chills, sweats, anorexia, fatigue, weakness, malaise, weight loss, and sleep disorder ?CV: Denies chest pain, angina, palpitations, syncope, orthopnea, PND, peripheral edema, and claudication. ?Resp: Denies dyspnea at rest, dyspnea with exercise, cough, sputum, wheezing, coughing up blood, and pleurisy. ?GI: Denies vomiting blood, jaundice, and fecal incontinence.   Denies dysphagia or odynophagia. ?GU : Denies urinary burning, blood in urine, urinary frequency,  urinary hesitancy, nocturnal urination, and urinary incontinence. ?MS: Denies joint pain, limitation of movement, and swelling, stiffness, low back pain, extremity pain. Denies muscle weakness, cramps, atrophy.  ?Derm: Denies rash, itching, dry skin, hives, moles, warts, or unhealing ulcers.  ?Psych: Denies depression, anxiety, memory loss, suicidal ideation, hallucinations, paranoia, and confusion. ?Heme: Denies bruising, bleeding, and enlarged lymph nodes. ?Neuro:  Denies any headaches, dizziness, paresthesias. ?Endo:  Denies any problems with DM, thyroid, adrenal function. ? ? ?Physical Exam: ?General:  Alert, well-developed, in NAD ?Head:  Normocephalic and atraumatic. ?Eyes:  Sclera clear, no icterus.   Conjunctiva pink. ?Ears:  Normal auditory acuity. ?Mouth:  No deformity or lesions.  ?Neck:  Supple; no masses . ?Lungs:  Clear throughout to auscultation.   No wheezes, crackles, or rhonchi. No acute distress. ?Heart:  Regular rate and rhythm; no murmurs. ?Abdomen:  Soft, nondistended, nontender. No masses, hepatomegaly. No obvious masses.  Normal bowel .    ?Rectal:  Deferred   ?Msk:  Symmetrical without gross deformities.Marland Kitchen ?Pulses:  Normal pulses noted. ?Extremities:  Without edema. ?Neurologic:  Alert and  oriented x4;  grossly normal neurologically. ?Skin:  Intact without significant lesions or rashes. ?Cervical Nodes:  No significant cervical adenopathy. ?Psych:  Alert and cooperative.  Normal mood and affect. ? ?Impression / Plan:  ? ?GERD and family history of colon polyps (brother and sister) here today for EGD and colonoscopy. ? ?Gevin Perea T. Fuller Plan  01/24/2022, 2:41 PM ?See AMION, L

## 2022-01-24 NOTE — Op Note (Signed)
[facility] ?Patient Name: Rachel Boone ?Procedure Date: 01/24/2022 2:46 PM ?MRN: [id number] ?Endoscopist: Ladene Artist , MD ?Age: 71 ?Referring MD:  ?Date of Birth: 11/25/1950 ?Gender: Female ?Account #: [id number] ?Procedure:                Upper GI endoscopy ?Indications:              Gastroesophageal reflux disease ?Medicines:                Monitored Anesthesia Care ?Procedure:                Pre-Anesthesia Assessment: ?                          - Prior to the procedure, a History and Physical  ?                          was performed, and patient medications and  ?                          allergies were reviewed. The patient's tolerance of  ?                          previous anesthesia was also reviewed. The risks  ?                          and benefits of the procedure and the sedation  ?                          options and risks were discussed with the patient.  ?                          All questions were answered, and informed consent  ?                          was obtained. Prior Anticoagulants: The patient has  ?                          taken no previous anticoagulant or antiplatelet  ?                          agents. ASA Grade Assessment: II - A patient with  ?                          mild systemic disease. After reviewing the risks  ?                          and benefits, the patient was deemed in  ?                          satisfactory condition to undergo the procedure. ?                          After obtaining informed consent, the endoscope was  ?  passed under direct vision. Throughout the  ?                          procedure, the patient's blood pressure, pulse, and  ?                          oxygen saturations were monitored continuously. The  ?                          Endoscope was introduced through the mouth, and  ?                          advanced to the second part of duodenum. The upper  ?                          GI endoscopy was  accomplished without difficulty.  ?                          The patient tolerated the procedure well. ?Scope In: ?Scope Out: ?Findings:                 LA Grade A (one or more mucosal breaks less than 5  ?                          mm, not extending between tops of 2 mucosal folds)  ?                          esophagitis with no bleeding was found at the  ?                          gastroesophageal junction. ?                          The exam of the esophagus was otherwise normal. ?                          A few localized small erosions with no bleeding and  ?                          no stigmata of recent bleeding were found in the  ?                          prepyloric region of the stomach. Biopsies were  ?                          taken with a cold forceps for histology. ?                          A small hiatal hernia was present. Hill Grade II. ?                          The exam of the stomach was otherwise normal. ?  The duodenal bulb and second portion of the  ?                          duodenum were normal. ?Complications:            No immediate complications. ?Estimated Blood Loss:     Estimated blood loss was minimal. ?Impression:               - LA Grade A reflux esophagitis with no bleeding. ?                          - Erosive gastropathy with no bleeding and no  ?                          stigmata of recent bleeding. Biopsied. ?                          - Small hiatal hernia. ?                          - Normal duodenal bulb and second portion of the  ?                          duodenum. ?Recommendation:           - Patient has a contact number available for  ?                          emergencies. The signs and symptoms of potential  ?                          delayed complications were discussed with the  ?                          patient. Return to normal activities tomorrow.  ?                          Written discharge instructions were provided to the  ?                           patient. ?                          - Resume previous diet. ?                          - Follow antireflux measures. ?                          - Continue present medications. ?                          - Increase pantoprazole to 40 mg po bid. ?                          - Start famotidine 40 mg po hs. ?                          -  Await pathology results. ?                          - Return to GI office in 6 weeks. ?Ladene Artist, MD ?01/24/2022 3:23:47 PM ?This report has been signed electronically. ?

## 2022-01-24 NOTE — Progress Notes (Signed)
Called to room to assist during endoscopic procedure.  Patient ID and intended procedure confirmed with present staff. Received instructions for my participation in the procedure from the performing physician.  

## 2022-01-24 NOTE — Progress Notes (Signed)
Pt's states no medical or surgical changes since previsit or office visit. 

## 2022-01-24 NOTE — Progress Notes (Signed)
To pacu, VSS. Report to [email or web address] 

## 2022-01-24 NOTE — Patient Instructions (Signed)
Handouts on polyps, hemorrhoids, and hiatal hernia given to patient. ?Await pathology results. ?Resume previous diet and continue present medications - continue following antireflux measures ?Pick up prescriptions for Pantoprazole & Famotidine from [facility]  ?Return to the GI office in 6 weeks for follow-up ?Repeat colonoscopy for surveillance will be determined based off of pathology results. ? ? ?YOU HAD AN ENDOSCOPIC PROCEDURE TODAY AT [facility]:   Refer to the procedure report that was given to you for any specific questions about what was found during the examination.  If the procedure report does not answer your questions, please call your gastroenterologist to clarify.  If you requested that your care partner not be given the details of your procedure findings, then the procedure report has been included in a sealed envelope for you to review at your convenience later. ? ?YOU SHOULD EXPECT: Some feelings of bloating in the abdomen. Passage of more gas than usual.  Walking can help get rid of the air that was put into your GI tract during the procedure and reduce the bloating. If you had a lower endoscopy (such as a colonoscopy or flexible sigmoidoscopy) you may notice spotting of blood in your stool or on the toilet paper. If you underwent a bowel prep for your procedure, you may not have a normal bowel movement for a few days. ? ?Please Note:  You might notice some irritation and congestion in your nose or some drainage.  This is from the oxygen used during your procedure.  There is no need for concern and it should clear up in a day or so. ? ?SYMPTOMS TO REPORT IMMEDIATELY: ? ?Following lower endoscopy (colonoscopy or flexible sigmoidoscopy): ? Excessive amounts of blood in the stool ? Significant tenderness or worsening of abdominal pains ? Swelling of the abdomen that is new, acute ? Fever of 100?F or higher ? ?Following upper endoscopy (EGD) ? Vomiting of  blood or coffee ground material ? New chest pain or pain under the shoulder blades ? Painful or persistently difficult swallowing ? New shortness of breath ? Fever of 100?F or higher ? Black, tarry-looking stools ? ?For urgent or emergent issues, a gastroenterologist can be reached at any hour by calling [phone]. ?Do not use MyChart messaging for urgent concerns.  ? ? ?DIET:  We do recommend a small meal at first, but then you may proceed to your regular diet.  Drink plenty of fluids but you should avoid alcoholic beverages for 24 hours. ? ?ACTIVITY:  You should plan to take it easy for the rest of today and you should NOT DRIVE or use heavy machinery until tomorrow (because of the sedation medicines used during the test).   ? ?FOLLOW UP: ?Our staff will call the number listed on your records 48-72 hours following your procedure to check on you and address any questions or concerns that you may have regarding the information given to you following your procedure. If we do not reach you, we will leave a message.  We will attempt to reach you two times.  During this call, we will ask if you have developed any symptoms of COVID 19. If you develop any symptoms (ie: fever, flu-like symptoms, shortness of breath, cough etc.) before then, please call [phone].  If you test positive for Covid 19 in the 2 weeks post procedure, please call and report this information to [place].   ? ?If any biopsies were taken you will be contacted by phone  or by letter within the next 1-3 weeks.  Please call us at [phone] if you have not heard about the biopsies in 3 weeks.  ? ? ?SIGNATURES/CONFIDENTIALITY: ?You and/or your care partner have signed paperwork which will be entered into your electronic medical record.  These signatures attest to the fact that that the information above on your After Visit Summary has been reviewed and is understood.  Full responsibility of the confidentiality of this discharge information lies  with you and/or your care-partner.  ?

## 2022-01-25 ENCOUNTER — Encounter: Payer: Self-pay | Admitting: Emergency Medicine

## 2022-01-25 ENCOUNTER — Other Ambulatory Visit: Payer: Self-pay | Admitting: *Deleted

## 2022-01-25 NOTE — Progress Notes (Signed)
Patient sent message via Mychart to remove rosuvastatin from her medication list as she is not taking.  ?

## 2022-01-26 ENCOUNTER — Telehealth: Payer: Self-pay | Admitting: *Deleted

## 2022-01-26 NOTE — Telephone Encounter (Signed)
?  Follow up Call- ? ? ?  01/24/2022  ?  1:45 PM  ?Call back number  ?Post procedure Call Back phone  # [phone]  ?Permission to leave phone message Yes  ?  ? ?Patient questions: ? ?Do you have a fever, pain , or abdominal swelling? No. ?Pain Score  0 * ? ?Have you tolerated food without any problems? Yes.   ? ?Have you been able to return to your normal activities? Yes.   ? ?Do you have any questions about your discharge instructions: ?Diet   No. ?Medications  No. ?Follow up visit  No. ? ?Do you have questions or concerns about your Care? No. ? ?Actions: ?* If pain score is 4 or above: ?No action needed, pain <4. ? ? ?

## 2022-02-02 ENCOUNTER — Ambulatory Visit
Admission: RE | Admit: 2022-02-02 | Discharge: 2022-02-02 | Disposition: A | Discharge status: Home / Self Care | Payer: PPO | Source: Ambulatory Visit | Attending: Emergency Medicine | Admitting: Emergency Medicine

## 2022-02-02 DIAGNOSIS — R922 Inconclusive mammogram: Secondary | ICD-10-CM | POA: Diagnosis not present

## 2022-02-02 DIAGNOSIS — N6489 Other specified disorders of breast: Secondary | ICD-10-CM

## 2022-02-02 DIAGNOSIS — Z09 Encounter for follow-up examination after completed treatment for conditions other than malignant neoplasm: Secondary | ICD-10-CM

## 2022-02-07 ENCOUNTER — Encounter: Payer: Self-pay | Admitting: Gastroenterology

## 2022-03-10 ENCOUNTER — Ambulatory Visit: Payer: PPO | Admitting: Gastroenterology

## 2022-06-24 ENCOUNTER — Other Ambulatory Visit: Payer: Self-pay | Admitting: Emergency Medicine

## 2022-06-24 DIAGNOSIS — M1990 Unspecified osteoarthritis, unspecified site: Secondary | ICD-10-CM

## 2022-08-22 ENCOUNTER — Telehealth: Payer: Self-pay | Admitting: Emergency Medicine

## 2022-08-22 NOTE — Telephone Encounter (Signed)
Left message for patient to call back to schedule Medicare Annual Wellness Visit   No hx of AWV eligible as of 08/12/16  Please schedule at anytime with [facility] Advisor if patient calls the office back.     Any questions, please call me at [phone]

## 2022-10-24 ENCOUNTER — Other Ambulatory Visit: Payer: Self-pay | Admitting: Gastroenterology

## 2022-10-24 DIAGNOSIS — K449 Diaphragmatic hernia without obstruction or gangrene: Secondary | ICD-10-CM

## 2022-10-24 DIAGNOSIS — K219 Gastro-esophageal reflux disease without esophagitis: Secondary | ICD-10-CM

## 2022-10-24 DIAGNOSIS — K259 Gastric ulcer, unspecified as acute or chronic, without hemorrhage or perforation: Secondary | ICD-10-CM

## 2022-10-31 ENCOUNTER — Telehealth: Payer: Self-pay

## 2022-10-31 DIAGNOSIS — M1990 Unspecified osteoarthritis, unspecified site: Secondary | ICD-10-CM

## 2022-10-31 MED ORDER — NAPROXEN 500 MG PO TABS: ORAL_TABLET | ORAL | 0 refills | Status: DC

## 2022-10-31 NOTE — Telephone Encounter (Signed)
New prescription sent to patient pharmacy

## 2022-10-31 NOTE — Telephone Encounter (Signed)
Seth Bake with [facility] called and asked that a rx be in sent with a 90 supply for   naproxen (NAPROSYN) 500 MG tablet

## 2023-01-21 ENCOUNTER — Other Ambulatory Visit: Payer: Self-pay | Admitting: Gastroenterology

## 2023-01-21 DIAGNOSIS — K219 Gastro-esophageal reflux disease without esophagitis: Secondary | ICD-10-CM

## 2023-01-21 DIAGNOSIS — K259 Gastric ulcer, unspecified as acute or chronic, without hemorrhage or perforation: Secondary | ICD-10-CM

## 2023-01-21 DIAGNOSIS — K449 Diaphragmatic hernia without obstruction or gangrene: Secondary | ICD-10-CM

## 2023-02-09 ENCOUNTER — Encounter: Payer: Self-pay | Admitting: Emergency Medicine

## 2023-02-09 ENCOUNTER — Ambulatory Visit (INDEPENDENT_AMBULATORY_CARE_PROVIDER_SITE_OTHER): Payer: PPO | Admitting: Emergency Medicine

## 2023-02-09 VITALS — BP 126/74 | HR 80 | Temp 98.3°F | Ht 67.0 in | Wt 158.4 lb

## 2023-02-09 DIAGNOSIS — K2100 Gastro-esophageal reflux disease with esophagitis, without bleeding: Secondary | ICD-10-CM | POA: Diagnosis not present

## 2023-02-09 DIAGNOSIS — K1379 Other lesions of oral mucosa: Secondary | ICD-10-CM

## 2023-02-09 DIAGNOSIS — K591 Functional diarrhea: Secondary | ICD-10-CM

## 2023-02-09 LAB — COMPREHENSIVE METABOLIC PANEL
ALT: 19 U/L (ref 0–35)
AST: 32 U/L (ref 0–37)
Albumin: 4.4 g/dL (ref 3.5–5.2)
Alkaline Phosphatase: 49 U/L (ref 39–117)
BUN: 18 mg/dL (ref 6–23)
CO2: 27 mEq/L (ref 19–32)
Calcium: 9.4 mg/dL (ref 8.4–10.5)
Chloride: 101 mEq/L (ref 96–112)
Creatinine, Ser: 0.83 mg/dL (ref 0.40–1.20)
GFR: 70.42 mL/min (ref >60.00)
Glucose, Bld: 82 mg/dL (ref 70–99)
Potassium: 4.6 mEq/L (ref 3.5–5.1)
Sodium: 139 mEq/L (ref 135–145)
Total Bilirubin: 0.6 mg/dL (ref 0.2–1.2)
Total Protein: 7.5 g/dL (ref 6.0–8.3)

## 2023-02-09 LAB — CBC WITH DIFFERENTIAL/PLATELET
Basophils Absolute: 0 10*3/uL (ref 0.0–0.1)
Basophils Relative: 0.4 % (ref 0.0–3.0)
Eosinophils Absolute: 0.1 10*3/uL (ref 0.0–0.7)
Eosinophils Relative: 0.9 % (ref 0.0–5.0)
HCT: 44.1 % (ref 36.0–46.0)
Hemoglobin: 14.4 g/dL (ref 12.0–15.0)
Lymphocytes Relative: 19.8 % (ref 12.0–46.0)
Lymphs Abs: 1.1 10*3/uL (ref 0.7–4.0)
MCHC: 32.8 g/dL (ref 30.0–36.0)
MCV: 89 fl (ref 78.0–100.0)
Monocytes Absolute: 0.3 10*3/uL (ref 0.1–1.0)
Monocytes Relative: 4.8 % (ref 3.0–12.0)
Neutro Abs: 4 10*3/uL (ref 1.4–7.7)
Neutrophils Relative %: 74.1 % (ref 43.0–77.0)
Platelets: 311 10*3/uL (ref 150.0–400.0)
RBC: 4.95 Mil/uL (ref 3.87–5.11)
RDW: 13.6 % (ref 11.5–15.5)
WBC: 5.4 10*3/uL (ref 4.0–10.5)

## 2023-02-09 LAB — LIPID PANEL
Cholesterol: 272 mg/dL — ABNORMAL HIGH (ref 0–200)
HDL: 77.3 mg/dL (ref >39.00)
LDL Cholesterol: 185 mg/dL — ABNORMAL HIGH (ref 0–99)
NonHDL: 194.38
Total CHOL/HDL Ratio: 4
Triglycerides: 49 mg/dL (ref 0.0–149.0)
VLDL: 9.8 mg/dL (ref 0.0–40.0)

## 2023-02-09 LAB — TSH: TSH: 1.17 u[IU]/mL (ref 0.35–5.50)

## 2023-02-09 LAB — HEMOGLOBIN A1C: Hgb A1c MFr Bld: 5.6 % (ref 4.6–6.5)

## 2023-02-09 MED ORDER — PANTOPRAZOLE SODIUM 40 MG PO TBEC: 40.0000 mg | DELAYED_RELEASE_TABLET | Freq: Every day | ORAL | 1 refills | Status: DC

## 2023-02-09 NOTE — Progress Notes (Signed)
Rachel Boone 72 y.o.   Chief Complaint  Patient presents with   Diarrhea    Patient has been having diarrhea x 2 months off and on in the am    skin issues    Patient has a itchy spot on her chest area, raised  a little     HISTORY OF PRESENT ILLNESS: This is a 72 y.o. female complaining of almost daily diarrhea for the past 2 months. Started when she ate cooked spinach 2 months ago.  Diarrhea was much worse in the first 48 hours. Still having some occasional nonbloody diarrhea.  Denies abdominal pain.  Able to eat and drink.  Denies nausea or vomiting. Also has a spot on her middle chest area that was a little bit raised couple weeks ago but better now.  Chronic lesion.  Told it was a sun spot by dermatologist. Also complaining of bony growth inside her mouth and occasional incapacitating discomfort throat sensation over the past couple months. No other complaints or medical concerns today.  Diarrhea  Pertinent negatives include no abdominal pain, chills, coughing, fever, headaches or vomiting.     Prior to Admission medications   Medication Sig Start Date End Date Taking? Authorizing Provider  magnesium oxide (MAG-OX) 400 MG tablet Take 400 mg by mouth daily.   Yes [provider]  naproxen (NAPROSYN) 500 MG tablet Take 1 tablet by mouth twice a daily as needed for moderate pain 10/31/22  Yes Elizebath Wever, Eilleen Kempf, MD  pantoprazole (PROTONIX) 40 MG tablet Take 1 tablet (40 mg total) by mouth 2 (two) times daily. 12/14/21 03/14/22  Georgina Quint, MD  pantoprazole (PROTONIX) 40 MG tablet Take 1 tablet (40 mg total) by mouth 2 (two) times daily. 10/24/22 01/22/23  Meryl Dare, MD  polyethylene glycol (MIRALAX / GLYCOLAX) 17 g packet Take 17 g by mouth daily.    [provider]    No Known Allergies  Patient Active Problem List   Diagnosis Date Noted   Primary osteoarthritis involving multiple joints 12/14/2021   Chronic right hip pain 12/14/2021    Multinodular goiter 09/10/2018   Gastro-esophageal reflux 05/23/2014   Arthritis 05/23/2014    Past Medical History:  Diagnosis Date   Allergy    SEASONAL   Arthritis    Phreesia 02/09/2020   Cataract    Bilateral   DDD (degenerative disc disease), cervical 04/2001   Endocervical polyp    pt unaware   Family history of anesthesia complication    Family history of PONV   Gastritis 05/27/2011   Noted on EGD   GERD (gastroesophageal reflux disease)    Hammertoe    Bilateral   Heart murmur    since birth, 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   Hiatal hernia 05/27/2011   Small, Noted on EGD   History of colon polyps 05/29/2015   Noted on colonscopy, pt unaware   Hyperlipidemia    Insomnia    Lipoma 07/30/2018   1.8cmx1.5cmx48mm, noted on CT neck   Mitral regurgitation    patient denied 10/21/13   Multinodular thyroid 07/30/2018   Noted on CT neck   OA (osteoarthritis)    PONV (postoperative nausea and vomiting)    takes meds thry iv to prevent nausea   Pupil dilation    Right eye   Pupil irregular    right eye legally blind right eye   Restless leg syndrome    Wears partial dentures    upper    Past Surgical History:  Procedure Laterality Date   ANTERIOR LATERAL LUMBAR FUSION 4 LEVELS  07/19/2012   Procedure: ANTERIOR LATERAL LUMBAR FUSION 4 LEVELS;  Surgeon: Hewitt Shorts, MD;  Location: [facility];  Service: Neurosurgery;  Laterality: N/A;  Lumbar one-two, lumbar two-three, lumbar three-four, lumbar four-five, lumbar five-sacral one anterolateral lumbar fusion with Lumbar one-sacral one posterior instrumentation   CATARACT EXTRACTION     bilateral   colonscopy  8 yrs ago   ESOPHAGOGASTRODUODENOSCOPY  05/27/2011   EYE SURGERY     HAMMER TOE SURGERY Bilateral    LIPOMA EXCISION N/A 11/14/2018   Procedure: EXCISION OF NECK LIPOMA;  Surgeon: Sheliah Hatch De Blanch, MD;  Location: [facility];  Service: General;  Laterality: N/A;   LUMBAR PERCUTANEOUS PEDICLE SCREW  3 LEVEL  07/19/2012   Procedure: LUMBAR PERCUTANEOUS PEDICLE SCREW 3 LEVEL;  Surgeon: Hewitt Shorts, MD;  Location: [facility];  Service: Neurosurgery;  Laterality: Bilateral;  Lumbar one-two, lumbar two-three, lumbar three-four, lumbar four-five, lumbar five-sacral one anterolateral lumbar fusion with Lumbar one-sacral one posterior instrumentation   PUPILLOPLASTY Right    SHOULDER ARTHROSCOPY Left 10/22/2013   Procedure: ARTHROSCOPY SHOULDER;  Surgeon: Nadara Mustard, MD;  Location: [facility];  Service: Orthopedics;  Laterality: Left;  Left Shoulder Arthroscopy, Debridement, and Decompression, subacromial decompression and distal clavicle resection   SPINE SURGERY N/A    Phreesia 02/09/2020   THUMB FUSION     LEFT    Social History   Socioeconomic History   Marital status: Single    Spouse name: Not on file   Number of children: Not on file   Years of education: Not on file   Highest education level: Associate degree: occupational, Scientist, product/process development, or vocational program  Occupational History   Occupation: Retired    Comment: [employer]  Tobacco Use   Smoking status: Former    Types: Cigarettes    Quit date: 09/12/1989    Years since quitting: 33.4   Smokeless tobacco: Never  Vaping Use   Vaping Use: Never used  Substance and Sexual Activity   Alcohol use: Yes    Comment: social beer   Drug use: No   Sexual activity: Not on file  Other Topics Concern   Not on file  Social History Narrative   Daily caffeine use    Social Determinants of Health   Financial Resource Strain: Low Risk  (02/08/2023)   Overall Financial Resource Strain (CARDIA)    Difficulty of Paying Living Expenses: Not hard at all  Food Insecurity: No Food Insecurity (02/08/2023)   Hunger Vital Sign    Worried About Running Out of Food in the Last Year: Never true    Ran Out of Food in the Last Year: Never true  Transportation Needs: No Transportation Needs (02/08/2023)   PRAPARE -  Administrator, Civil Service (Medical): No    Lack of Transportation (Non-Medical): No  Physical Activity: Sufficiently Active (02/08/2023)   Exercise Vital Sign    Days of Exercise per Week: 5 days    Minutes of Exercise per Session: 60 min  Stress: No Stress Concern Present (02/08/2023)   [employer] of Occupational Health - Occupational Stress Questionnaire    Feeling of Stress : Not at all  Social Connections: Moderately Isolated (02/08/2023)   Social Connection and Isolation Panel [NHANES]    Frequency of Communication with Friends and Family: More than three times a week    Frequency of Social Gatherings with Friends  and Family: More than three times a week    Attends Religious Services: More than 4 times per year    Active Member of Clubs or Organizations: No    Attends Engineer, structural: Not on file    Marital Status: Never married  Catering manager Violence: Not on file    Family History  Problem Relation Age of Onset   Lung cancer Mother    Diabetes type II Mother    Heart disease Father    Colon polyps Sister        adenomatous   Hypertension Sister    Diabetes type II Sister    Colon polyps Brother    Hypertension Brother    Colon cancer Neg Hx    Breast cancer Neg Hx    Esophageal cancer Neg Hx    Stomach cancer Neg Hx    Pancreatic cancer Neg Hx      Review of Systems  Constitutional: Negative.  Negative for chills and fever.  HENT: Negative.  Negative for congestion and sore throat.   Respiratory: Negative.  Negative for cough and shortness of breath.   Cardiovascular: Negative.  Negative for chest pain and palpitations.  Gastrointestinal:  Positive for diarrhea. Negative for abdominal pain, blood in stool, melena, nausea and vomiting.  Genitourinary: Negative.  Negative for dysuria and hematuria.  Musculoskeletal: Negative.   Skin: Negative.        Dark flat spot mid chest  Neurological: Negative.  Negative for dizziness and  headaches.  All other systems reviewed and are negative.   Vitals:   02/09/23 1032  BP: 126/74  Pulse: 80  Temp: 98.3 F (36.8 C)  SpO2: 94%   Wt Readings from Last 3 Encounters:  02/09/23 158 lb 6 oz (71.8 kg)  01/24/22 162 lb (73.5 kg)  12/14/21 160 lb 2 oz (72.6 kg)     Physical Exam Constitutional:      Appearance: Normal appearance.  HENT:     Head: Normocephalic.     Mouth/Throat:     Mouth: Mucous membranes are moist.     Pharynx: Oropharynx is clear.     Comments: Large bony growth left anterior mouth floor Eyes:     Extraocular Movements: Extraocular movements intact.     Pupils: Pupils are equal, round, and reactive to light.  Neck:     Vascular: No carotid bruit.  Cardiovascular:     Rate and Rhythm: Normal rate and regular rhythm.     Heart sounds: Murmur (Systolic 2/6 aortic area) heard.  Pulmonary:     Effort: Pulmonary effort is normal.     Breath sounds: Normal breath sounds.  Abdominal:     General: There is no distension.     Palpations: Abdomen is soft.     Tenderness: There is no abdominal tenderness.  Musculoskeletal:     Cervical back: No tenderness.  Lymphadenopathy:     Cervical: No cervical adenopathy.  Skin:    General: Skin is warm and dry.  Neurological:     Mental Status: She is alert and oriented to person, place, and time.  Psychiatric:        Mood and Affect: Mood normal.        Behavior: Behavior normal.      ASSESSMENT & PLAN: A total of 45 minutes was spent with the patient and counseling/coordination of care regarding preparing for this visit, review of most recent office visit notes, review of chronic medical conditions and their management,  review of all medications, need for blood work today, need for evaluation of mouth floor mass by oral surgeon, prognosis, documentation, and need for follow-up.  Problem List Items Addressed This Visit       Digestive   Gastro-esophageal reflux    Chronic problem Presently on  pantoprazole 40 mg twice a day. Possible cause of diarrhea.  Recommend to decrease to daily dosing and monitor symptoms.      Relevant Medications   pantoprazole (PROTONIX) 40 MG tablet   Functional diarrhea - Primary    Unremarkable colonoscopy report from last year. Was taken laxative due to constipation but not since diarrhea started No red flag signs or symptoms Normal abdominal exam. Blood work done today Differential diagnosis discussed Diet and nutrition discussed May need GI evaluation Recommend to decrease pantoprazole to 40 mg daily      Relevant Orders   CBC with Differential/Platelet   Comprehensive metabolic panel   Hemoglobin A1c   TSH   Lipid panel     Other   Mass of floor of mouth    Concerning.  Also having throat symptoms. Needs evaluation by oral surgeon.  Malignancy a possibility. Referral placed today.      Relevant Orders   Ambulatory referral to Oral Maxillofacial Surgery   Patient Instructions  Diarrhea, Adult Diarrhea is when you pass loose and sometimes watery poop (stool) often. Diarrhea can make you feel weak and cause you to lose water in your body (get dehydrated). Losing water in your body can cause you to: Feel tired and thirsty. Have a dry mouth. Go pee (urinate) less often. Diarrhea often lasts 2-3 days. It can last longer if it is a sign of something more serious. Be sure to treat your diarrhea as told by your doctor. Follow these instructions at home: Eating and drinking     Follow these instructions as told by your doctor: Take an ORS (oral rehydration solution). This is a drink that helps you replace fluids and minerals your body lost. It is sold at pharmacies and stores. Drink enough fluid to keep your pee (urine) pale yellow. Drink fluids such as: Water. You can also get fluids by sucking on ice chips. Diluted fruit juice. Low-calorie sports drinks. Milk. Avoid drinking fluids that have a lot of sugar or caffeine in them.  These include soda, energy drinks, and regular sports drinks. Avoid alcohol. Eat bland, easy-to-digest foods in small amounts as you are able. These foods include: Bananas. Applesauce. Rice. Low-fat (lean) meats. Toast. Crackers. Avoid spicy or fatty foods.  Medicines Take over-the-counter and prescription medicines only as told by your doctor. If you were prescribed antibiotics, take them as told by your doctor. Do not stop taking them even if you start to feel better. General instructions  Wash your hands often using soap and water for 20 seconds. If soap and water are not available, use hand sanitizer. Others in your home should wash their hands as well. Wash your hands: After using the toilet or changing a diaper. Before preparing, cooking, or serving food. While caring for a sick person. While visiting someone in a hospital. Rest at home while you get better. Take a warm bath to help with any burning or pain from having diarrhea. Watch your condition for any changes. Contact a doctor if: You have a fever. Your diarrhea gets worse. You have new symptoms. You vomit every time you eat or drink. You feel light-headed, dizzy, or you have a headache. You have muscle  cramps. You have signs of losing too much water in your body, such as: Dark pee, very little pee, or no pee. Cracked lips. Dry mouth. Sunken eyes. Sleepiness. Weakness. You have bloody or black poop or poop that looks like tar. You have very bad pain, cramping, or bloating in your belly (abdomen). Your skin feels cold and clammy. You feel confused. Get help right away if: You have chest pain. Your heart is beating very quickly. You have trouble breathing or you are breathing very quickly. You feel very weak or you faint. These symptoms may be an emergency. Get help right away. Call 911. Do not wait to see if the symptoms will go away. Do not drive yourself to the hospital. This information is not intended  to replace advice given to you by your health care provider. Make sure you discuss any questions you have with your health care provider. Document Revised: 02/15/2022 Document Reviewed: 02/15/2022 Elsevier Patient Education  2024 Elsevier Inc.      Edwina Barth, MD [facility]

## 2023-02-09 NOTE — Assessment & Plan Note (Signed)
Concerning.  Also having throat symptoms. Needs evaluation by oral surgeon.  Malignancy a possibility. Referral placed today.

## 2023-02-09 NOTE — Assessment & Plan Note (Signed)
Chronic problem Presently on pantoprazole 40 mg twice a day. Possible cause of diarrhea.  Recommend to decrease to daily dosing and monitor symptoms.

## 2023-02-09 NOTE — Patient Instructions (Signed)
Diarrhea, Adult Diarrhea is when you pass loose and sometimes watery poop (stool) often. Diarrhea can make you feel weak and cause you to lose water in your body (get dehydrated). Losing water in your body can cause you to: Feel tired and thirsty. Have a dry mouth. Go pee (urinate) less often. Diarrhea often lasts 2-3 days. It can last longer if it is a sign of something more serious. Be sure to treat your diarrhea as told by your doctor. Follow these instructions at home: Eating and drinking     Follow these instructions as told by your doctor: Take an ORS (oral rehydration solution). This is a drink that helps you replace fluids and minerals your body lost. It is sold at pharmacies and stores. Drink enough fluid to keep your pee (urine) pale yellow. Drink fluids such as: Water. You can also get fluids by sucking on ice chips. Diluted fruit juice. Low-calorie sports drinks. Milk. Avoid drinking fluids that have a lot of sugar or caffeine in them. These include soda, energy drinks, and regular sports drinks. Avoid alcohol. Eat bland, easy-to-digest foods in small amounts as you are able. These foods include: Bananas. Applesauce. Rice. Low-fat (lean) meats. Toast. Crackers. Avoid spicy or fatty foods.  Medicines Take over-the-counter and prescription medicines only as told by your doctor. If you were prescribed antibiotics, take them as told by your doctor. Do not stop taking them even if you start to feel better. General instructions  Wash your hands often using soap and water for 20 seconds. If soap and water are not available, use hand sanitizer. Others in your home should wash their hands as well. Wash your hands: After using the toilet or changing a diaper. Before preparing, cooking, or serving food. While caring for a sick person. While visiting someone in a hospital. Rest at home while you get better. Take a warm bath to help with any burning or pain from having  diarrhea. Watch your condition for any changes. Contact a doctor if: You have a fever. Your diarrhea gets worse. You have new symptoms. You vomit every time you eat or drink. You feel light-headed, dizzy, or you have a headache. You have muscle cramps. You have signs of losing too much water in your body, such as: Dark pee, very little pee, or no pee. Cracked lips. Dry mouth. Sunken eyes. Sleepiness. Weakness. You have bloody or black poop or poop that looks like tar. You have very bad pain, cramping, or bloating in your belly (abdomen). Your skin feels cold and clammy. You feel confused. Get help right away if: You have chest pain. Your heart is beating very quickly. You have trouble breathing or you are breathing very quickly. You feel very weak or you faint. These symptoms may be an emergency. Get help right away. Call 911. Do not wait to see if the symptoms will go away. Do not drive yourself to the hospital. This information is not intended to replace advice given to you by your health care provider. Make sure you discuss any questions you have with your health care provider. Document Revised: 02/15/2022 Document Reviewed: 02/15/2022 Elsevier Patient Education  2024 [employer].

## 2023-02-09 NOTE — Assessment & Plan Note (Addendum)
Unremarkable colonoscopy report from last year. Was taken laxative due to constipation but not since diarrhea started No red flag signs or symptoms Normal abdominal exam. Blood work done today Differential diagnosis discussed Diet and nutrition discussed May need GI evaluation Recommend to decrease pantoprazole to 40 mg daily

## 2023-02-17 ENCOUNTER — Encounter: Payer: Self-pay | Admitting: Emergency Medicine

## 2023-02-21 ENCOUNTER — Encounter: Payer: Self-pay | Admitting: Emergency Medicine

## 2023-02-21 ENCOUNTER — Other Ambulatory Visit: Payer: Self-pay | Admitting: *Deleted

## 2023-02-21 DIAGNOSIS — K1379 Other lesions of oral mucosa: Secondary | ICD-10-CM

## 2023-02-21 NOTE — Telephone Encounter (Signed)
Patient called and said she wanted the referral to go to a provider at [facility]. She lives closer to there than this office. Best callback is [phone].

## 2023-02-21 NOTE — Telephone Encounter (Signed)
error 

## 2023-02-22 ENCOUNTER — Encounter: Payer: Self-pay | Admitting: Emergency Medicine

## 2023-03-02 ENCOUNTER — Encounter: Payer: Self-pay | Admitting: Emergency Medicine

## 2023-04-07 ENCOUNTER — Telehealth: Payer: Self-pay | Admitting: Emergency Medicine

## 2023-04-07 NOTE — Telephone Encounter (Signed)
Patient called and said she used to take pantoprazole (PROTONIX) 40 MG tablet twice a day and Dr. Alvy Bimler lowered it to once a day. She said it's not helping when it's once. She would like to know if he can change her back to 2 pills a day. Patient would like a call back at [phone].

## 2023-04-07 NOTE — Telephone Encounter (Signed)
MD is ut of the office will forward to his desktop for review...Raechel Chute

## 2023-04-08 ENCOUNTER — Other Ambulatory Visit: Payer: Self-pay | Admitting: Emergency Medicine

## 2023-04-08 DIAGNOSIS — K2100 Gastro-esophageal reflux disease with esophagitis, without bleeding: Secondary | ICD-10-CM

## 2023-04-08 MED ORDER — PANTOPRAZOLE SODIUM 40 MG PO TBEC: 40.0000 mg | DELAYED_RELEASE_TABLET | Freq: Two times a day (BID) | ORAL | 1 refills | Status: DC

## 2023-04-08 NOTE — Telephone Encounter (Signed)
New prescription for pantoprazole twice a day sent to pharmacy of record today.

## 2023-04-10 NOTE — Telephone Encounter (Signed)
Called pt no answer LMOM MD sent new rx to [facility].Marland KitchenRaechel Chute

## 2023-04-11 ENCOUNTER — Other Ambulatory Visit: Payer: Self-pay | Admitting: Emergency Medicine

## 2023-04-11 DIAGNOSIS — M1990 Unspecified osteoarthritis, unspecified site: Secondary | ICD-10-CM

## 2023-04-13 DIAGNOSIS — K1370 Unspecified lesions of oral mucosa: Secondary | ICD-10-CM | POA: Diagnosis not present

## 2023-04-13 DIAGNOSIS — M270 Developmental disorders of jaws: Secondary | ICD-10-CM | POA: Diagnosis not present

## 2023-05-11 DIAGNOSIS — M270 Developmental disorders of jaws: Secondary | ICD-10-CM | POA: Diagnosis not present

## 2023-07-13 ENCOUNTER — Other Ambulatory Visit: Payer: Self-pay | Admitting: Emergency Medicine

## 2023-07-13 DIAGNOSIS — K2100 Gastro-esophageal reflux disease with esophagitis, without bleeding: Secondary | ICD-10-CM

## 2023-07-17 ENCOUNTER — Encounter: Payer: Self-pay | Admitting: Emergency Medicine

## 2023-07-24 ENCOUNTER — Other Ambulatory Visit: Payer: Self-pay | Admitting: Emergency Medicine

## 2023-07-24 DIAGNOSIS — M1990 Unspecified osteoarthritis, unspecified site: Secondary | ICD-10-CM

## 2023-09-02 ENCOUNTER — Other Ambulatory Visit: Payer: Self-pay | Admitting: Emergency Medicine

## 2023-09-02 DIAGNOSIS — K2100 Gastro-esophageal reflux disease with esophagitis, without bleeding: Secondary | ICD-10-CM

## 2023-10-12 DIAGNOSIS — Z09 Encounter for follow-up examination after completed treatment for conditions other than malignant neoplasm: Secondary | ICD-10-CM | POA: Diagnosis not present

## 2023-10-18 ENCOUNTER — Other Ambulatory Visit: Payer: Self-pay | Admitting: Emergency Medicine

## 2023-10-18 DIAGNOSIS — M1990 Unspecified osteoarthritis, unspecified site: Secondary | ICD-10-CM

## 2023-10-26 DIAGNOSIS — Z09 Encounter for follow-up examination after completed treatment for conditions other than malignant neoplasm: Secondary | ICD-10-CM | POA: Diagnosis not present

## 2024-02-08 ENCOUNTER — Other Ambulatory Visit (HOSPITAL_BASED_OUTPATIENT_CLINIC_OR_DEPARTMENT_OTHER): Payer: Self-pay | Admitting: Emergency Medicine

## 2024-02-08 DIAGNOSIS — Z1231 Encounter for screening mammogram for malignant neoplasm of breast: Secondary | ICD-10-CM

## 2024-02-13 ENCOUNTER — Encounter (HOSPITAL_BASED_OUTPATIENT_CLINIC_OR_DEPARTMENT_OTHER): Payer: Self-pay | Admitting: Radiology

## 2024-02-13 ENCOUNTER — Ambulatory Visit (HOSPITAL_BASED_OUTPATIENT_CLINIC_OR_DEPARTMENT_OTHER)
Admission: RE | Admit: 2024-02-13 | Discharge: 2024-02-13 | Disposition: A | Discharge status: Home / Self Care | Source: Ambulatory Visit | Attending: Emergency Medicine | Admitting: Emergency Medicine

## 2024-02-13 DIAGNOSIS — Z1231 Encounter for screening mammogram for malignant neoplasm of breast: Secondary | ICD-10-CM | POA: Insufficient documentation

## 2024-02-16 ENCOUNTER — Ambulatory Visit: Payer: Self-pay | Admitting: Emergency Medicine

## 2024-03-06 ENCOUNTER — Other Ambulatory Visit: Payer: Self-pay | Admitting: Emergency Medicine

## 2024-03-06 DIAGNOSIS — M1990 Unspecified osteoarthritis, unspecified site: Secondary | ICD-10-CM

## 2024-03-06 DIAGNOSIS — K2100 Gastro-esophageal reflux disease with esophagitis, without bleeding: Secondary | ICD-10-CM

## 2024-06-07 ENCOUNTER — Other Ambulatory Visit: Payer: Self-pay | Admitting: Emergency Medicine

## 2024-06-07 DIAGNOSIS — M1990 Unspecified osteoarthritis, unspecified site: Secondary | ICD-10-CM

## 2024-06-07 DIAGNOSIS — K2100 Gastro-esophageal reflux disease with esophagitis, without bleeding: Secondary | ICD-10-CM

## 2024-06-11 ENCOUNTER — Other Ambulatory Visit: Payer: Self-pay | Admitting: Emergency Medicine

## 2024-06-11 DIAGNOSIS — M1990 Unspecified osteoarthritis, unspecified site: Secondary | ICD-10-CM

## 2024-06-11 DIAGNOSIS — K2100 Gastro-esophageal reflux disease with esophagitis, without bleeding: Secondary | ICD-10-CM

## 2024-06-17 ENCOUNTER — Telehealth: Payer: Self-pay | Admitting: Emergency Medicine

## 2024-06-17 NOTE — Telephone Encounter (Unsigned)
 Copied from CRM [id number]. Topic: Clinical - Medication Refill >> Jun 17, 2024  2:39 PM Alfonso HERO wrote: Medication: naproxen  (NAPROSYN ) 500 MG tablet pantoprazole  (PROTONIX ) 40 MG tablet    Has the patient contacted their pharmacy? No (Agent: If no, request that the patient contact the pharmacy for the refill. If patient does not wish to contact the pharmacy document the reason why and proceed with request.) (Agent: If yes, when and what did the pharmacy advise?)  This is the patient's preferred pharmacy:  [facility] [address] [place] - [address] [place] [address] Phone: [phone] Fax: [phone]  Is this the correct pharmacy for this prescription? Yes If no, delete pharmacy and type the correct one.   Has the prescription been filled recently? No  Is the patient out of the medication? Yes  Has the patient been seen for an appointment in the last year OR does the patient have an upcoming appointment? Yes  Can we respond through MyChart? Yes  Agent: Please be advised that Rx refills may take up to 3 business days. We ask that you follow-up with your pharmacy.

## 2024-06-21 NOTE — Telephone Encounter (Unsigned)
 Copied from CRM #[id number]. Topic: Clinical - Medication Question >> Jun 21, 2024  9:56 AM Berneda FALCON wrote: Reason for CRM: Patient requested refill on 9/30 for naproxen  (NAPROSYN ) 500 MG tablet and pantoprazole  (PROTONIX ) 40 MG tablet and it has not been filled yet. Can we check on this please? Both should be 90-day supply please.  Patient is completely out of both of them  [facility] [address], [place] - [address] [place] [address] Phone: [phone] Fax: [phone] Hours: Not open 24 hours  Please call back with any updates-[phone] (home)

## 2024-06-24 ENCOUNTER — Other Ambulatory Visit: Payer: Self-pay | Admitting: Radiology

## 2024-06-24 DIAGNOSIS — M1990 Unspecified osteoarthritis, unspecified site: Secondary | ICD-10-CM

## 2024-06-24 DIAGNOSIS — K2100 Gastro-esophageal reflux disease with esophagitis, without bleeding: Secondary | ICD-10-CM

## 2024-06-24 MED ORDER — NAPROXEN 500 MG PO TABS: ORAL_TABLET | ORAL | 0 refills | Status: DC

## 2024-06-24 MED ORDER — PANTOPRAZOLE SODIUM 40 MG PO TBEC: 40.0000 mg | DELAYED_RELEASE_TABLET | Freq: Every day | ORAL | 0 refills | Status: DC

## 2024-06-24 NOTE — Telephone Encounter (Signed)
 Okay to refill?

## 2024-06-25 NOTE — Telephone Encounter (Signed)
 Prefer office visits

## 2024-09-20 ENCOUNTER — Ambulatory Visit

## 2024-10-01 ENCOUNTER — Ambulatory Visit

## 2024-10-01 ENCOUNTER — Telehealth: Payer: Self-pay

## 2024-10-01 ENCOUNTER — Other Ambulatory Visit: Payer: Self-pay

## 2024-10-01 VITALS — BP 120/78 | HR 68 | Ht 64.5 in | Wt 161.2 lb

## 2024-10-01 DIAGNOSIS — Z780 Asymptomatic menopausal state: Secondary | ICD-10-CM

## 2024-10-01 DIAGNOSIS — Z0000 Encounter for general adult medical examination without abnormal findings: Secondary | ICD-10-CM

## 2024-10-01 DIAGNOSIS — K2100 Gastro-esophageal reflux disease with esophagitis, without bleeding: Secondary | ICD-10-CM

## 2024-10-01 DIAGNOSIS — M1990 Unspecified osteoarthritis, unspecified site: Secondary | ICD-10-CM

## 2024-10-01 MED ORDER — NAPROXEN 500 MG PO TABS: ORAL_TABLET | ORAL | 0 refills | Status: AC

## 2024-10-01 MED ORDER — PANTOPRAZOLE SODIUM 40 MG PO TBEC: 40.0000 mg | DELAYED_RELEASE_TABLET | Freq: Every day | ORAL | 0 refills | Status: AC

## 2024-10-01 NOTE — Telephone Encounter (Signed)
 Requesting a 3-mth refill on Naproxen  and Pantoprazole  to [facility] pharmacy

## 2024-10-01 NOTE — Progress Notes (Signed)
 "  Chief Complaint  Patient presents with   Medicare Wellness     Subjective:   Rachel Boone is a 74 y.o. female who presents for a Medicare Annual Wellness Visit.  Visit info / Clinical Intake: Medicare Wellness Visit Type:: Initial Annual Wellness Visit Persons participating in visit and providing information:: patient Medicare Wellness Visit Mode:: In-person (required for WTM) Interpreter Needed?: No Pre-visit prep was completed: yes AWV questionnaire completed by patient prior to visit?: yes Date:: 09/27/24 Living arrangements:: (!) lives alone Patient's Overall Health Status Rating: excellent Typical amount of pain: some (hands - arthritis) Does pain affect daily life?: (!) yes Are you currently prescribed opioids?: no  Dietary Habits and Nutritional Risks How many meals a day?: 2 Eats fruit and vegetables daily?: yes Most meals are obtained by: preparing own meals In the last 2 weeks, have you had any of the following?: none Diabetic:: no  Functional Status Activities of Daily Living (to include ambulation/medication): Independent Ambulation: Independent with device- listed below Home Assistive Devices/Equipment: Eyeglasses Medication Administration: Independent Home Management (perform basic housework or laundry): Independent Manage your own finances?: yes Primary transportation is: driving Concerns about vision?: no *vision screening is required for WTM* Concerns about hearing?: no  Fall Screening Falls in the past year?: 0 Number of falls in past year: 0 Was there an injury with Fall?: 0 Fall Risk Category Calculator: 0 Patient Fall Risk Level: Low Fall Risk  Fall Risk Patient at Risk for Falls Due to: No Fall Risks Fall risk Follow up: Falls evaluation completed; Falls prevention discussed  Home and Transportation Safety: All rugs have non-skid backing?: N/A, no rugs All stairs or steps have railings?: yes Grab bars in the bathtub or shower?: (!)  no Have non-skid surface in bathtub or shower?: (!) no Good home lighting?: yes Regular seat belt use?: yes Hospital stays in the last year:: no  Cognitive Assessment Difficulty concentrating, remembering, or making decisions? : no Will 6CIT or Mini Cog be Completed: yes What year is it?: 0 points What month is it?: 0 points Give patient an address phrase to remember (5 components): [address] [place], [place] About what time is it?: 0 points Count backwards from 20 to 1: 0 points Say the months of the year in reverse: 0 points Repeat the address phrase from earlier: 2 points (27) 6 CIT Score: 2 points  Advance Directives (For Healthcare) Does Patient Have a Medical Advance Directive?: No Would patient like information on creating a medical advance directive?: No - Patient declined  Reviewed/Updated  Reviewed/Updated: Reviewed All (Medical, Surgical, Family, Medications, Allergies, Care Teams, Patient Goals)    Allergies (verified) Patient has no known allergies.   Current Medications (verified) Outpatient Encounter Medications as of 10/01/2024  Medication Sig   magnesium  oxide (MAG-OX) 400 MG tablet Take 400 mg by mouth daily.   naproxen  (NAPROSYN ) 500 MG tablet TAKE 1 TABLET BY MOUTH TWICE DAILY AS NEEDED FOR MODERATE PAIN   pantoprazole  (PROTONIX ) 40 MG tablet Take 1 tablet (40 mg total) by mouth daily.   [DISCONTINUED] polyethylene glycol (MIRALAX / GLYCOLAX) 17 g packet Take 17 g by mouth daily.   No facility-administered encounter medications on file as of 10/01/2024.    History: Past Medical History:  Diagnosis Date   Allergy    SEASONAL   Arthritis    Phreesia 02/09/2020   Cataract    Bilateral   DDD (degenerative disc disease), cervical 04/2001   Endocervical polyp    pt  unaware   Family history of anesthesia complication    Family history of PONV   Gastritis 05/27/2011   Noted on EGD   GERD (gastroesophageal reflux disease)    Hammertoe    Bilateral    Heart murmur    since birth, 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   Hiatal hernia 05/27/2011   Small, Noted on EGD   History of colon polyps 05/29/2015   Noted on colonscopy, pt unaware   Hyperlipidemia    Insomnia    Lipoma 07/30/2018   1.8cmx1.5cmx71mm, noted on CT neck   Mitral regurgitation    patient denied 10/21/13   Multinodular thyroid  07/30/2018   Noted on CT neck   OA (osteoarthritis)    PONV (postoperative nausea and vomiting)    takes meds thry iv to prevent nausea   Pupil dilation    Right eye   Pupil irregular    right eye legally blind right eye   Restless leg syndrome    Wears partial dentures    upper   Past Surgical History:  Procedure Laterality Date   ANTERIOR LATERAL LUMBAR FUSION 4 LEVELS  07/19/2012   Procedure: ANTERIOR LATERAL LUMBAR FUSION 4 LEVELS;  Surgeon: Lamar LELON Peaches, MD;  Location: [facility];  Service: Neurosurgery;  Laterality: N/A;  Lumbar one-two, lumbar two-three, lumbar three-four, lumbar four-five, lumbar five-sacral one anterolateral lumbar fusion with Lumbar one-sacral one posterior instrumentation   CATARACT EXTRACTION     bilateral   colonscopy  8 yrs ago   ESOPHAGOGASTRODUODENOSCOPY  05/27/2011   EYE SURGERY     HAMMER TOE SURGERY Bilateral    LIPOMA EXCISION N/A 11/14/2018   Procedure: EXCISION OF NECK LIPOMA;  Surgeon: Stevie Herlene Righter, MD;  Location: [facility];  Service: General;  Laterality: N/A;   LUMBAR PERCUTANEOUS PEDICLE SCREW 3 LEVEL  07/19/2012   Procedure: LUMBAR PERCUTANEOUS PEDICLE SCREW 3 LEVEL;  Surgeon: Lamar LELON Peaches, MD;  Location: [facility];  Service: Neurosurgery;  Laterality: Bilateral;  Lumbar one-two, lumbar two-three, lumbar three-four, lumbar four-five, lumbar five-sacral one anterolateral lumbar fusion with Lumbar one-sacral one posterior instrumentation   PUPILLOPLASTY Right    SHOULDER ARTHROSCOPY Left 10/22/2013   Procedure: ARTHROSCOPY SHOULDER;  Surgeon: Jerona LULLA Sage, MD;  Location: [facility];  Service: Orthopedics;  Laterality: Left;  Left Shoulder Arthroscopy, Debridement, and Decompression, subacromial decompression and distal clavicle resection   SPINE SURGERY N/A    Phreesia 02/09/2020   THUMB FUSION     LEFT   Family History  Problem Relation Age of Onset   Lung cancer Mother    Diabetes type II Mother    Heart disease Father    Colon polyps Sister        adenomatous   Hypertension Sister    Diabetes type II Sister    Colon polyps Brother    Hypertension Brother    Colon cancer Neg Hx    Breast cancer Neg Hx    Esophageal cancer Neg Hx    Stomach cancer Neg Hx    Pancreatic cancer Neg Hx    Social History   Occupational History   Occupation: Retired    Comment: Sales Executive  Tobacco Use   Smoking status: Former    Current packs/day: 0.00    Types: Cigarettes    Quit date: 09/12/1989    Years since quitting: 35.0   Smokeless tobacco: Never  Vaping Use   Vaping status: Never Used  Substance and Sexual Activity  Alcohol use: Yes    Alcohol/week: 1.0 standard drink of alcohol    Types: 1 Cans of beer per week    Comment: social beer   Drug use: No   Sexual activity: Not Currently    Birth control/protection: Post-menopausal   Tobacco Counseling Counseling given: Yes  SDOH Screenings   Food Insecurity: No Food Insecurity (09/27/2024)  Housing: Low Risk (10/01/2024)  Transportation Needs: No Transportation Needs (10/01/2024)  Utilities: Not At Risk (10/01/2024)  Alcohol Screen: Low Risk (09/27/2024)  Depression (PHQ2-9): Low Risk (10/01/2024)  Financial Resource Strain: Low Risk (09/27/2024)  Physical Activity: Sufficiently Active (10/01/2024)  Social Connections: Moderately Isolated (10/01/2024)  Stress: No Stress Concern Present (10/01/2024)  Tobacco Use: Medium Risk (10/01/2024)  Health Literacy: Adequate Health Literacy (10/01/2024)   See flowsheets for full screening details  Depression Screen PHQ 2 & 9 Depression Scale-  Over the past 2 weeks, how often have you been bothered by any of the following problems? Little interest or pleasure in doing things: 0 Feeling down, depressed, or hopeless (PHQ Adolescent also includes...irritable): 0 PHQ-2 Total Score: 0 Trouble falling or staying asleep, or sleeping too much: 0 Feeling tired or having little energy: 0 Poor appetite or overeating (PHQ Adolescent also includes...weight loss): 0 Feeling bad about yourself - or that you are a failure or have let yourself or your family down: 0 Trouble concentrating on things, such as reading the newspaper or watching television (PHQ Adolescent also includes...like school work): 0 Moving or speaking so slowly that other people could have noticed. Or the opposite - being so fidgety or restless that you have been moving around a lot more than usual: 0 Thoughts that you would be better off dead, or of hurting yourself in some way: 0 PHQ-9 Total Score: 0 If you checked off any problems, how difficult have these problems made it for you to do your work, take care of things at home, or get along with other people?: Not difficult at all  Depression Treatment Depression Interventions/Treatment : [id number] Score <4 Follow-up Not Indicated     Goals Addressed               This Visit's Progress     Patient Stated (pt-stated)        Patient stated she plans to continue to manage her diet and bp readings             Objective:    Today's Vitals   10/01/24 1407  BP: 120/78  Pulse: 68  SpO2: 98%  Weight: 161 lb 3.2 oz (73.1 kg)  Height: 5' 4.5 (1.638 m)  PainSc: 6   PainLoc: Hand   Body mass index is 27.24 kg/m.  Hearing/Vision screen Hearing Screening - Comments:: Denies hearing difficulties   Vision Screening - Comments:: Wears rx glasses - up to date with routine eye exams with Dr Kennyth w/[facility] Immunizations and Health Maintenance Health Maintenance  Topic Date Due   Bone Density Scan  Never done    COVID-19 Vaccine (4 - 2025-26 season) 05/13/2024   DTaP/Tdap/Td (2 - Td or Tdap) 05/23/2024   Influenza Vaccine  12/10/2024 (Originally 04/12/2024)   Zoster Vaccines- Shingrix (1 of 2) 12/30/2024 (Originally 09/03/2000)   Pneumococcal Vaccine: 50+ Years (1 of 1 - PCV) 10/01/2025 (Originally 09/03/2000)   Medicare Annual Wellness (AWV)  10/01/2025   Mammogram  02/12/2026   Colonoscopy  01/25/2027   Hepatitis C Screening  Completed   Meningococcal B Vaccine  Aged Out  Assessment/Plan:  This is a routine wellness examination for Arthea.  DEXA scan status: ordered today  I have recommended that this patient have a immunization for Influenza, Pneumonia, and Shingles but she declines at this time. I have discussed the risks and benefits of this procedure with her. The patient verbalizes understanding. Pt is considering to get the Tdap vaccine at local pharmacy.  Patient Care Team: Purcell Emil Schanz, MD as PCP - General (Internal Medicine) Nancyann DOROTHA Ebbing, M.D., PA  I have personally reviewed and noted the following in the patients chart:   Medical and social history Use of alcohol, tobacco or illicit drugs  Current medications and supplements including opioid prescriptions. Functional ability and status Nutritional status Physical activity Advanced directives List of other physicians Hospitalizations, surgeries, and ER visits in previous 12 months Vitals Screenings to include cognitive, depression, and falls Referrals and appointments  Orders Placed This Encounter  Procedures   DG Bone Density    Standing Status:   Future    Expiration Date:   10/01/2025    Reason for Exam (SYMPTOM  OR DIAGNOSIS REQUIRED):   postmenopausal deficiency    Preferred imaging location?:   [address]   In addition, I have reviewed and discussed with patient certain preventive protocols, quality metrics, and best practice recommendations. A written personalized care plan for  preventive services as well as general preventive health recommendations were provided to patient.   Verdie CHRISTELLA Saba, CMA   10/01/2024   Return in 1 year (on 10/01/2025).  After Visit Summary: (In Person-Declined) Patient declined AVS at this time.  Nurse Notes: scheduled a CPE appt w/PCP for 10/2024; scheduled 2027 AWV appt "

## 2024-10-01 NOTE — Patient Instructions (Addendum)
 Rachel Boone,  Thank you for taking the time for your Medicare Wellness Visit. I appreciate your continued commitment to your health goals. Please review the care plan we discussed, and feel free to reach out if I can assist you further.  Please note that Annual Wellness Visits do not include a physical exam. Some assessments may be limited, especially if the visit was conducted virtually. If needed, we may recommend an in-person follow-up with your provider.  Ongoing Care Seeing your primary care provider every 3 to 6 months helps us  monitor your health and provide consistent, personalized care.   Referrals If a referral was made during today's visit and you haven't received any updates within two weeks, please contact the referred provider directly to check on the status.  Recommended Screenings:  Health Maintenance  Topic Date Due   Pneumococcal Vaccine for age over 54 (1 of 1 - PCV) Never done   Zoster (Shingles) Vaccine (1 of 2) Never done   Osteoporosis screening with Bone Density Scan  Never done   Flu Shot  Never done   COVID-19 Vaccine (4 - 2025-26 season) 05/13/2024   DTaP/Tdap/Td vaccine (2 - Td or Tdap) 05/23/2024   Medicare Annual Wellness Visit  10/01/2025   Breast Cancer Screening  02/12/2026   Colon Cancer Screening  01/25/2027   Hepatitis C Screening  Completed   Meningitis B Vaccine  Aged Out       11/14/2018    6:48 AM  Advanced Directives  Does Patient Have a Medical Advance Directive? No   Would patient like information on creating a medical advance directive? No - Patient declined      Data saved with a previous flowsheet row definition    Vision: Annual vision screenings are recommended for early detection of glaucoma, cataracts, and diabetic retinopathy. These exams can also reveal signs of chronic conditions such as diabetes and high blood pressure.  Dental: Annual dental screenings help detect early signs of oral cancer, gum disease, and other  conditions linked to overall health, including heart disease and diabetes.

## 2024-10-16 ENCOUNTER — Encounter: Admitting: Emergency Medicine

## 2024-10-23 ENCOUNTER — Encounter: Admitting: Emergency Medicine

## 2025-10-20 ENCOUNTER — Encounter: Admitting: Emergency Medicine

## 2025-10-20 ENCOUNTER — Ambulatory Visit
